# Patient Record
Sex: Male | Born: 1975 | Race: Black or African American | Hispanic: No | Marital: Single | Smoking: Current every day smoker
Health system: Southern US, Community
[De-identification: ages and names within clinical notes are randomized; demographics above are authoritative.]

## PROBLEM LIST (undated history)

## (undated) DIAGNOSIS — K219 Gastro-esophageal reflux disease without esophagitis: Secondary | ICD-10-CM

## (undated) DIAGNOSIS — F1010 Alcohol abuse, uncomplicated: Secondary | ICD-10-CM

## (undated) DIAGNOSIS — G629 Polyneuropathy, unspecified: Secondary | ICD-10-CM

## (undated) DIAGNOSIS — E876 Hypokalemia: Secondary | ICD-10-CM

## (undated) HISTORY — PX: TONSILLECTOMY AND ADENOIDECTOMY: SUR1326

---

## 2013-12-22 DIAGNOSIS — R748 Abnormal levels of other serum enzymes: Secondary | ICD-10-CM | POA: Diagnosis present

## 2016-01-26 ENCOUNTER — Inpatient Hospital Stay (HOSPITAL_BASED_OUTPATIENT_CLINIC_OR_DEPARTMENT_OTHER)
Admission: EM | Admit: 2016-01-26 | Discharge: 2016-02-02 | DRG: 641 | Disposition: A | Discharge status: Home / Self Care | Payer: Self-pay | Attending: Family Medicine | Admitting: Family Medicine

## 2016-01-26 ENCOUNTER — Encounter (HOSPITAL_BASED_OUTPATIENT_CLINIC_OR_DEPARTMENT_OTHER): Payer: Self-pay | Admitting: *Deleted

## 2016-01-26 DIAGNOSIS — H547 Unspecified visual loss: Secondary | ICD-10-CM | POA: Diagnosis present

## 2016-01-26 DIAGNOSIS — Z6821 Body mass index (BMI) 21.0-21.9, adult: Secondary | ICD-10-CM

## 2016-01-26 DIAGNOSIS — I10 Essential (primary) hypertension: Secondary | ICD-10-CM | POA: Diagnosis present

## 2016-01-26 DIAGNOSIS — E7220 Disorder of urea cycle metabolism, unspecified: Secondary | ICD-10-CM | POA: Diagnosis present

## 2016-01-26 DIAGNOSIS — F329 Major depressive disorder, single episode, unspecified: Secondary | ICD-10-CM | POA: Diagnosis present

## 2016-01-26 DIAGNOSIS — F17210 Nicotine dependence, cigarettes, uncomplicated: Secondary | ICD-10-CM | POA: Diagnosis present

## 2016-01-26 DIAGNOSIS — F10239 Alcohol dependence with withdrawal, unspecified: Secondary | ICD-10-CM | POA: Diagnosis present

## 2016-01-26 DIAGNOSIS — D649 Anemia, unspecified: Secondary | ICD-10-CM | POA: Diagnosis present

## 2016-01-26 DIAGNOSIS — E876 Hypokalemia: Principal | ICD-10-CM | POA: Diagnosis present

## 2016-01-26 DIAGNOSIS — G608 Other hereditary and idiopathic neuropathies: Secondary | ICD-10-CM | POA: Diagnosis present

## 2016-01-26 DIAGNOSIS — F1020 Alcohol dependence, uncomplicated: Secondary | ICD-10-CM | POA: Diagnosis present

## 2016-01-26 DIAGNOSIS — R748 Abnormal levels of other serum enzymes: Secondary | ICD-10-CM | POA: Diagnosis present

## 2016-01-26 DIAGNOSIS — H539 Unspecified visual disturbance: Secondary | ICD-10-CM

## 2016-01-26 DIAGNOSIS — M7989 Other specified soft tissue disorders: Secondary | ICD-10-CM | POA: Diagnosis present

## 2016-01-26 DIAGNOSIS — Z789 Other specified health status: Secondary | ICD-10-CM

## 2016-01-26 DIAGNOSIS — E512 Wernicke's encephalopathy: Secondary | ICD-10-CM | POA: Diagnosis present

## 2016-01-26 DIAGNOSIS — R740 Nonspecific elevation of levels of transaminase and lactic acid dehydrogenase [LDH]: Secondary | ICD-10-CM | POA: Diagnosis present

## 2016-01-26 DIAGNOSIS — H109 Unspecified conjunctivitis: Secondary | ICD-10-CM | POA: Diagnosis present

## 2016-01-26 DIAGNOSIS — G629 Polyneuropathy, unspecified: Secondary | ICD-10-CM

## 2016-01-26 DIAGNOSIS — E440 Moderate protein-calorie malnutrition: Secondary | ICD-10-CM | POA: Diagnosis present

## 2016-01-26 DIAGNOSIS — Z7289 Other problems related to lifestyle: Secondary | ICD-10-CM

## 2016-01-26 LAB — CBC WITH DIFFERENTIAL/PLATELET
BASOS ABS: 0.1 10*3/uL (ref 0.0–0.1)
BASOS PCT: 1 %
EOS ABS: 0.1 10*3/uL (ref 0.0–0.7)
Eosinophils Relative: 1 %
HCT: 30.2 % — ABNORMAL LOW (ref 39.0–52.0)
HEMOGLOBIN: 11.2 g/dL — AB (ref 13.0–17.0)
LYMPHS ABS: 2.6 10*3/uL (ref 0.7–4.0)
LYMPHS PCT: 25 %
MCH: 40 pg — AB (ref 26.0–34.0)
MCHC: 37.1 g/dL — AB (ref 30.0–36.0)
MCV: 107.9 fL — ABNORMAL HIGH (ref 78.0–100.0)
MONO ABS: 1.3 10*3/uL — AB (ref 0.1–1.0)
Monocytes Relative: 13 %
NEUTROS ABS: 6.2 10*3/uL (ref 1.7–7.7)
Neutrophils Relative %: 60 %
PLATELETS: 208 10*3/uL (ref 150–400)
RBC: 2.8 MIL/uL — ABNORMAL LOW (ref 4.22–5.81)
RDW: 14.1 % (ref 11.5–15.5)
WBC: 10.3 10*3/uL (ref 4.0–10.5)

## 2016-01-26 LAB — URINALYSIS, ROUTINE W REFLEX MICROSCOPIC
Glucose, UA: NEGATIVE mg/dL
Hgb urine dipstick: NEGATIVE
Ketones, ur: 15 mg/dL — AB
LEUKOCYTES UA: NEGATIVE
NITRITE: NEGATIVE
PH: 6.5 (ref 5.0–8.0)
Protein, ur: NEGATIVE mg/dL
SPECIFIC GRAVITY, URINE: 1.016 (ref 1.005–1.030)

## 2016-01-26 LAB — COMPREHENSIVE METABOLIC PANEL
ALBUMIN: 3.3 g/dL — AB (ref 3.5–5.0)
ALK PHOS: 182 U/L — AB (ref 38–126)
ALT: 47 U/L (ref 17–63)
ANION GAP: 19 — AB (ref 5–15)
AST: 137 U/L — ABNORMAL HIGH (ref 15–41)
BUN: 5 mg/dL — ABNORMAL LOW (ref 6–20)
CALCIUM: 7 mg/dL — AB (ref 8.9–10.3)
CHLORIDE: 84 mmol/L — AB (ref 101–111)
CO2: 31 mmol/L (ref 22–32)
Creatinine, Ser: 0.55 mg/dL — ABNORMAL LOW (ref 0.61–1.24)
GFR calc non Af Amer: >60 mL/min (ref >60)
GLUCOSE: 134 mg/dL — AB (ref 65–99)
POTASSIUM: 1.9 mmol/L — AB (ref 3.5–5.1)
SODIUM: 134 mmol/L — AB (ref 135–145)
Total Bilirubin: 1.4 mg/dL — ABNORMAL HIGH (ref 0.3–1.2)
Total Protein: 6.8 g/dL (ref 6.5–8.1)

## 2016-01-26 LAB — PHOSPHORUS: Phosphorus: 2.7 mg/dL (ref 2.5–4.6)

## 2016-01-26 LAB — BRAIN NATRIURETIC PEPTIDE: B Natriuretic Peptide: 51.3 pg/mL (ref 0.0–100.0)

## 2016-01-26 LAB — ETHANOL: ALCOHOL ETHYL (B): 31 mg/dL — AB (ref <5)

## 2016-01-26 LAB — MAGNESIUM: MAGNESIUM: 1 mg/dL — AB (ref 1.7–2.4)

## 2016-01-26 LAB — CBG MONITORING, ED: GLUCOSE-CAPILLARY: 160 mg/dL — AB (ref 65–99)

## 2016-01-26 MED ORDER — ACETAMINOPHEN 500 MG PO TABS
1000.0000 mg | ORAL_TABLET | Freq: Once | ORAL | Status: AC
  Administered 2016-01-26: 1000 mg via ORAL
  Filled 2016-01-26: qty 2

## 2016-01-26 MED ORDER — SODIUM CHLORIDE 0.9 % IV SOLN
Freq: Once | INTRAVENOUS | Status: AC
  Administered 2016-01-26: via INTRAVENOUS

## 2016-01-26 MED ORDER — MAGNESIUM SULFATE 2 GM/50ML IV SOLN
2.0000 g | Freq: Once | INTRAVENOUS | Status: AC
  Administered 2016-01-26: 2 g via INTRAVENOUS

## 2016-01-26 MED ORDER — SODIUM CHLORIDE 0.9 % IV BOLUS (SEPSIS)
1000.0000 mL | Freq: Once | INTRAVENOUS | Status: AC
  Administered 2016-01-26: 1000 mL via INTRAVENOUS

## 2016-01-26 MED ORDER — POTASSIUM CHLORIDE 10 MEQ/100ML IV SOLN
10.0000 meq | INTRAVENOUS | Status: AC
  Administered 2016-01-26 – 2016-01-27 (×3): 10 meq via INTRAVENOUS
  Filled 2016-01-26 (×3): qty 100

## 2016-01-26 MED ORDER — MAGNESIUM SULFATE 50 % IJ SOLN
2.0000 g | Freq: Once | INTRAMUSCULAR | Status: DC
  Filled 2016-01-26: qty 4

## 2016-01-26 MED ORDER — POTASSIUM CHLORIDE CRYS ER 20 MEQ PO TBCR
40.0000 meq | EXTENDED_RELEASE_TABLET | Freq: Once | ORAL | Status: AC
  Administered 2016-01-26: 40 meq via ORAL
  Filled 2016-01-26: qty 2

## 2016-01-26 NOTE — ED Notes (Signed)
Nurse first-pt assisted from car to w/c to ED WR-pt was able to stand and take steps from car to w/c-w/c to scale in triage and back to w/c

## 2016-01-26 NOTE — ED Notes (Signed)
Attempted 2nd IV x2, unsuccessful.

## 2016-01-26 NOTE — ED Provider Notes (Signed)
[facility]   CSN: [id number] Arrival date & time: 01/26/16  2009  First Provider Contact:  First MD Initiated Contact with Patient 01/26/16 2019     By signing my name below, I, Albert Lara. Albert Lara, attest that this documentation has been prepared under the direction and in the presence of Lyndal Pulley, MD.  Electronically Signed: Suzan Lara. Albert Lara, ED Scribe. 01/26/16. 8:49 PM.    History   Chief Complaint Chief Complaint  Patient presents with  . Leg Swelling    The history is provided by the patient. No language interpreter was used.    HPI Comments: Albert Lara is a 40 y.o. male without any pertinent past medical history who presents to the Emergency Department complaining of intermittent, worsening bilateral leg pain with associated swelling x 2 months. Discomfort is described as "needles". Discomfort to feet exacerbated with deep and light touch to lower extremities. No alleviating factors at this time. No OTC/prescribed medications or home remedies attempted prior to arrival. Pt also reports bilateral visual disturbances described as "blurriness". He states "i can see that the TV is on but I cant see what is pictured". No prior evaluation for above symptoms. Pt states last complete physical was approximately 2 years ago. No known sick contacts. He reports long distance travel as he states he is often in a car for 4-5 hours at a time. Pt states Mother has a history of Type 1 diabetes. No known allergies to medications.   PCP: No primary care provider on file.    History reviewed. No pertinent past medical history.  There are no active problems to display for this patient.   Past Surgical History:  Procedure Laterality Date  . TONSILLECTOMY         Home Medications    Prior to Admission medications   Not on File    Family History History reviewed. No pertinent family history.  Social History Social History  Substance Use Topics  .  Smoking status: Current Every Day Smoker    Packs/day: 1.00    Types: Cigarettes  . Smokeless tobacco: Not on file  . Alcohol use Yes     Comment: quart of liquor  today     Allergies   Review of patient's allergies indicates no known allergies.   Review of Systems Review of Systems  Constitutional: Negative for chills and fever.  Eyes: Positive for visual disturbance.  Respiratory: Negative for cough.   Cardiovascular: Positive for leg swelling.  Musculoskeletal: Positive for arthralgias.  Neurological: Negative for weakness.  Psychiatric/Behavioral: Negative for confusion.  All other systems reviewed and are negative.    Physical Exam Updated Vital Signs BP 118/92   Pulse (!) 138   Temp 99.1 F (37.3 C)   Resp 18   Ht 5\' 8"  (1.727 m)   Wt 146 lb (66.2 kg)   SpO2 100%   BMI 22.20 kg/m   Physical Exam  Constitutional: He is oriented to person, place, and time. He appears well-developed and well-nourished.  HENT:  Head: Normocephalic and atraumatic.  Eyes: EOM are normal.  4 mm crusted lesion just medial to the R medial campus. It is well demarcated.   Neck: Normal range of motion.  Cardiovascular: Normal rate, regular rhythm, normal heart sounds and intact distal pulses.  Exam reveals no friction rub.   No murmur heard. Pulmonary/Chest: Effort normal and breath sounds normal. No respiratory distress. He has no wheezes. He has no rales.  Abdominal: Soft. He  exhibits no distension. There is no tenderness.  Musculoskeletal: Normal range of motion.  3+ pitting edema on the R up to the level of the ankle. 2+ pitting edema on the L up to the level of the ankle.  Neurological: He is alert and oriented to person, place, and time.  Skin: Skin is warm and dry.  Psychiatric: He has a normal mood and affect. Judgment normal.  Nursing note and vitals reviewed.    ED Treatments / Results   DIAGNOSTIC STUDIES: Oxygen Saturation is 100% on RA, Normal by my  interpretation.    COORDINATION OF CARE: 8:45 PM- Will order blood work. Discussed treatment plan with pt at bedside and pt agreed to plan.     Labs (all labs ordered are listed, but only abnormal results are displayed) Labs Reviewed  CBC WITH DIFFERENTIAL/PLATELET - Abnormal; Notable for the following:       Result Value   RBC 2.80 (*)    Hemoglobin 11.2 (*)    HCT 30.2 (*)    MCV 107.9 (*)    MCH 40.0 (*)    MCHC 37.1 (*)    Monocytes Absolute 1.3 (*)    All other components within normal limits  COMPREHENSIVE METABOLIC PANEL - Abnormal; Notable for the following:    Sodium 134 (*)    Potassium 1.9 (*)    Chloride 84 (*)    Glucose, Bld 134 (*)    BUN 5 (*)    Creatinine, Ser 0.55 (*)    Calcium 7.0 (*)    Albumin 3.3 (*)    AST 137 (*)    Alkaline Phosphatase 182 (*)    Total Bilirubin 1.4 (*)    Anion gap 19 (*)    All other components within normal limits  URINALYSIS, ROUTINE W REFLEX MICROSCOPIC (NOT AT [facility]) - Abnormal; Notable for the following:    Color, Urine AMBER (*)    Bilirubin Urine SMALL (*)    Ketones, ur 15 (*)    All other components within normal limits  ETHANOL - Abnormal; Notable for the following:    Alcohol, Ethyl (B) 31 (*)    All other components within normal limits  MAGNESIUM - Abnormal; Notable for the following:    Magnesium 1.0 (*)    All other components within normal limits  CBG MONITORING, ED - Abnormal; Notable for the following:    Glucose-Capillary 160 (*)    All other components within normal limits  BRAIN NATRIURETIC PEPTIDE  PHOSPHORUS  RPR  TSH  T4, FREE    EKG  EKG Interpretation  Date/Time:  Thursday January 26 2016 21:44:35 EDT Ventricular Rate:  135 PR Interval:    QRS Duration: 69 QT Interval:  324 QTC Calculation: 486 R Axis:   44 Text Interpretation:  Sinus tachycardia Anteroseptal infarct, old Nonspecific T abnormalities, lateral leads Baseline wander in lead(s) V2 V6 Confirmed by Jaimeson Gopal MD, Maiya Kates ([id number])  on 01/26/2016 10:09:00 PM       Radiology No results found.  Procedures Procedures (including critical care time)  CRITICAL CARE Performed by: Lyndal Pulley Total critical care time: 30 minutes Critical care time was exclusive of separately billable procedures and treating other patients. Critical care was necessary to treat or prevent imminent or life-threatening deterioration. Critical care was time spent personally by me on the following activities: development of treatment plan with patient and/or surrogate as well as nursing, discussions with consultants, evaluation of patient's response to treatment, examination of patient, obtaining history from  patient or surrogate, ordering and performing treatments and interventions, ordering and review of laboratory studies, ordering and review of radiographic studies, pulse oximetry and re-evaluation of patient's condition.  Medications Ordered in ED Medications - No data to display   Initial Impression / Assessment and Plan / ED Course  I have reviewed the triage vital signs and the nursing notes.  Pertinent labs & imaging results that were available during my care of the patient were reviewed by me and considered in my medical decision making (see chart for details).  Clinical Course    40 year old male chronic alcoholic presents with progressive bilateral lower extremity pitting edema, pain, visual difficulty and a sore on the right side of his nose. He has no medical care prior to today and does not carry any diagnoses or medications. He endorses a neuropathic pain of both of his legs and his hands that is very severe with loss of sensation in a stocking and glove distribution.   His daily pint of vodka consumption appears to have contributed to a critical acute hypokalemia and concomitant hypomagnesemia which appear to be inducing neurologic symptoms. No EKG changes were noted. Plan will be for admission for cardiac monitoring and  correction of electrolyte disturbances and to complete workup for potential etiologies. IV potassium and magnesium as well as oral potassium were provided in the emergency department. Patient will need monitoring for withdrawal as an inpatient. Hospitalist was consulted for admission and accepted the patient in transfer to facility capable of caring for patient further.  Final Clinical Impressions(s) / ED Diagnoses   Final diagnoses:  Acute hypokalemia  Hypomagnesemia   I personally performed the services described in this documentation, which was scribed in my presence. The recorded information has been reviewed and is accurate.    New Prescriptions New Prescriptions   No medications on file     Lyndal Pulley, MD 01/26/16 2329

## 2016-01-26 NOTE — ED Triage Notes (Addendum)
Pt c/o bil leg swelling and pain x 2 months. ETOH

## 2016-01-26 NOTE — ED Notes (Addendum)
Lab called with critical K+ of 1.9. Dr. Clydene Pugh notified. Pt placed on cardiac monitor with EKG obtained. IV site initiated.

## 2016-01-27 ENCOUNTER — Inpatient Hospital Stay (HOSPITAL_COMMUNITY): Payer: Self-pay

## 2016-01-27 ENCOUNTER — Encounter (HOSPITAL_BASED_OUTPATIENT_CLINIC_OR_DEPARTMENT_OTHER): Payer: Self-pay | Admitting: Emergency Medicine

## 2016-01-27 DIAGNOSIS — H539 Unspecified visual disturbance: Secondary | ICD-10-CM

## 2016-01-27 DIAGNOSIS — G629 Polyneuropathy, unspecified: Secondary | ICD-10-CM

## 2016-01-27 DIAGNOSIS — R748 Abnormal levels of other serum enzymes: Secondary | ICD-10-CM

## 2016-01-27 DIAGNOSIS — M79609 Pain in unspecified limb: Secondary | ICD-10-CM

## 2016-01-27 DIAGNOSIS — E440 Moderate protein-calorie malnutrition: Secondary | ICD-10-CM | POA: Insufficient documentation

## 2016-01-27 DIAGNOSIS — E876 Hypokalemia: Secondary | ICD-10-CM | POA: Diagnosis present

## 2016-01-27 DIAGNOSIS — M7989 Other specified soft tissue disorders: Secondary | ICD-10-CM

## 2016-01-27 DIAGNOSIS — F1020 Alcohol dependence, uncomplicated: Secondary | ICD-10-CM | POA: Diagnosis present

## 2016-01-27 LAB — IRON AND TIBC
Iron: 155 ug/dL (ref 45–182)
SATURATION RATIOS: 91 % — AB (ref 17.9–39.5)
TIBC: 171 ug/dL — AB (ref 250–450)
UIBC: 16 ug/dL

## 2016-01-27 LAB — COMPREHENSIVE METABOLIC PANEL
ALBUMIN: 2.8 g/dL — AB (ref 3.5–5.0)
ALK PHOS: 181 U/L — AB (ref 38–126)
ALT: 41 U/L (ref 17–63)
ANION GAP: 14 (ref 5–15)
AST: 122 U/L — ABNORMAL HIGH (ref 15–41)
BUN: <5 mg/dL — ABNORMAL LOW (ref 6–20)
CHLORIDE: 92 mmol/L — AB (ref 101–111)
CO2: 30 mmol/L (ref 22–32)
Calcium: 6.6 mg/dL — ABNORMAL LOW (ref 8.9–10.3)
Creatinine, Ser: 0.66 mg/dL (ref 0.61–1.24)
GFR calc Af Amer: >60 mL/min (ref >60)
GFR calc non Af Amer: >60 mL/min (ref >60)
GLUCOSE: 99 mg/dL (ref 65–99)
POTASSIUM: 3 mmol/L — AB (ref 3.5–5.1)
SODIUM: 136 mmol/L (ref 135–145)
Total Bilirubin: 1.4 mg/dL — ABNORMAL HIGH (ref 0.3–1.2)
Total Protein: 5.8 g/dL — ABNORMAL LOW (ref 6.5–8.1)

## 2016-01-27 LAB — BASIC METABOLIC PANEL
Anion gap: 8 (ref 5–15)
CHLORIDE: 100 mmol/L — AB (ref 101–111)
CO2: 28 mmol/L (ref 22–32)
Calcium: 6.6 mg/dL — ABNORMAL LOW (ref 8.9–10.3)
Creatinine, Ser: 0.49 mg/dL — ABNORMAL LOW (ref 0.61–1.24)
GFR calc Af Amer: >60 mL/min (ref >60)
GFR calc non Af Amer: >60 mL/min (ref >60)
GLUCOSE: 91 mg/dL (ref 65–99)
POTASSIUM: 3.9 mmol/L (ref 3.5–5.1)
Sodium: 136 mmol/L (ref 135–145)

## 2016-01-27 LAB — LACTIC ACID, PLASMA
LACTIC ACID, VENOUS: 2.6 mmol/L — AB (ref 0.5–1.9)
LACTIC ACID, VENOUS: 2.3 mmol/L — AB (ref 0.5–1.9)
Lactic Acid, Venous: 2.6 mmol/L (ref 0.5–1.9)

## 2016-01-27 LAB — CBC
HEMATOCRIT: 28.5 % — AB (ref 39.0–52.0)
HEMOGLOBIN: 9.9 g/dL — AB (ref 13.0–17.0)
MCH: 39.1 pg — AB (ref 26.0–34.0)
MCHC: 34.7 g/dL (ref 30.0–36.0)
MCV: 112.6 fL — AB (ref 78.0–100.0)
Platelets: 142 10*3/uL — ABNORMAL LOW (ref 150–400)
RBC: 2.53 MIL/uL — ABNORMAL LOW (ref 4.22–5.81)
RDW: 15.6 % — ABNORMAL HIGH (ref 11.5–15.5)
WBC: 11.2 10*3/uL — ABNORMAL HIGH (ref 4.0–10.5)

## 2016-01-27 LAB — URIC ACID: URIC ACID, SERUM: 3.6 mg/dL — AB (ref 4.4–7.6)

## 2016-01-27 LAB — VITAMIN B12: Vitamin B-12: 302 pg/mL (ref 180–914)

## 2016-01-27 LAB — HIV ANTIBODY (ROUTINE TESTING W REFLEX): HIV SCREEN 4TH GENERATION: NONREACTIVE

## 2016-01-27 LAB — URINALYSIS, ROUTINE W REFLEX MICROSCOPIC
Glucose, UA: NEGATIVE mg/dL
HGB URINE DIPSTICK: NEGATIVE
Ketones, ur: NEGATIVE mg/dL
Leukocytes, UA: NEGATIVE
Nitrite: NEGATIVE
Protein, ur: NEGATIVE mg/dL
SPECIFIC GRAVITY, URINE: 1.016 (ref 1.005–1.030)
pH: 7 (ref 5.0–8.0)

## 2016-01-27 LAB — FERRITIN: Ferritin: 1915 ng/mL — ABNORMAL HIGH (ref 24–336)

## 2016-01-27 LAB — AMYLASE: Amylase: 98 U/L (ref 28–100)

## 2016-01-27 LAB — T4, FREE: Free T4: 1.06 ng/dL (ref 0.61–1.12)

## 2016-01-27 LAB — RETICULOCYTES
RBC.: 2.53 MIL/uL — ABNORMAL LOW (ref 4.22–5.81)
Retic Count, Absolute: 50.6 10*3/uL (ref 19.0–186.0)
Retic Ct Pct: 2 % (ref 0.4–3.1)

## 2016-01-27 LAB — LIPASE, BLOOD: Lipase: 43 U/L (ref 11–51)

## 2016-01-27 LAB — TSH
TSH: 3.582 u[IU]/mL (ref 0.350–4.500)
TSH: 3.334 u[IU]/mL (ref 0.350–4.500)

## 2016-01-27 LAB — GLUCOSE, CAPILLARY: Glucose-Capillary: 132 mg/dL — ABNORMAL HIGH (ref 65–99)

## 2016-01-27 MED ORDER — TRAMADOL-ACETAMINOPHEN 37.5-325 MG PO TABS: 1.0000 | ORAL_TABLET | Freq: Once | ORAL | Status: DC

## 2016-01-27 MED ORDER — MORPHINE SULFATE (PF) 4 MG/ML IV SOLN
4.0000 mg | Freq: Once | INTRAVENOUS | Status: AC
  Administered 2016-01-27: 4 mg via INTRAVENOUS

## 2016-01-27 MED ORDER — FAMOTIDINE 20 MG PO TABS
20.0000 mg | ORAL_TABLET | Freq: Two times a day (BID) | ORAL | Status: DC
  Administered 2016-01-28 – 2016-02-02 (×12): 20 mg via ORAL
  Filled 2016-01-27 (×12): qty 1

## 2016-01-27 MED ORDER — TRAMADOL HCL 50 MG PO TABS
50.0000 mg | ORAL_TABLET | Freq: Four times a day (QID) | ORAL | Status: DC | PRN
  Administered 2016-01-27 – 2016-02-02 (×12): 50 mg via ORAL
  Filled 2016-01-27 (×12): qty 1

## 2016-01-27 MED ORDER — ENOXAPARIN SODIUM 40 MG/0.4ML ~~LOC~~ SOLN
40.0000 mg | SUBCUTANEOUS | Status: DC
  Administered 2016-01-27 – 2016-02-02 (×7): 40 mg via SUBCUTANEOUS
  Filled 2016-01-27 (×7): qty 0.4

## 2016-01-27 MED ORDER — SODIUM CHLORIDE 0.9 % IV SOLN
1.0000 g | Freq: Once | INTRAVENOUS | Status: AC
  Administered 2016-01-27: 1 g via INTRAVENOUS
  Filled 2016-01-27: qty 10

## 2016-01-27 MED ORDER — BOOST / RESOURCE BREEZE PO LIQD
1.0000 | Freq: Three times a day (TID) | ORAL | Status: DC
  Administered 2016-01-27 – 2016-02-01 (×14): 1 via ORAL

## 2016-01-27 MED ORDER — THIAMINE HCL 100 MG/ML IJ SOLN
100.0000 mg | Freq: Every day | INTRAMUSCULAR | Status: DC
  Filled 2016-01-27: qty 2

## 2016-01-27 MED ORDER — ACETAMINOPHEN 325 MG PO TABS
325.0000 mg | ORAL_TABLET | Freq: Once | ORAL | Status: AC
  Administered 2016-01-27: 325 mg via ORAL
  Filled 2016-01-27: qty 1

## 2016-01-27 MED ORDER — ENSURE ENLIVE PO LIQD
237.0000 mL | Freq: Two times a day (BID) | ORAL | Status: DC
  Administered 2016-01-27: 237 mL via ORAL

## 2016-01-27 MED ORDER — PNEUMOCOCCAL VAC POLYVALENT 25 MCG/0.5ML IJ INJ
0.5000 mL | INJECTION | INTRAMUSCULAR | Status: AC
  Administered 2016-01-30: 0.5 mL via INTRAMUSCULAR
  Filled 2016-01-27 (×2): qty 0.5

## 2016-01-27 MED ORDER — ACETAMINOPHEN 325 MG PO TABS
650.0000 mg | ORAL_TABLET | Freq: Four times a day (QID) | ORAL | Status: DC | PRN
  Administered 2016-01-29 (×3): 650 mg via ORAL
  Filled 2016-01-27 (×3): qty 2

## 2016-01-27 MED ORDER — SODIUM CHLORIDE 0.9 % IV SOLN
INTRAVENOUS | Status: DC
  Administered 2016-01-27 – 2016-01-29 (×2): via INTRAVENOUS

## 2016-01-27 MED ORDER — LORAZEPAM 1 MG PO TABS
1.0000 mg | ORAL_TABLET | Freq: Four times a day (QID) | ORAL | Status: DC | PRN
  Administered 2016-01-28 – 2016-01-31 (×7): 1 mg via ORAL
  Filled 2016-01-27 (×7): qty 1

## 2016-01-27 MED ORDER — GABAPENTIN 400 MG PO CAPS
400.0000 mg | ORAL_CAPSULE | Freq: Three times a day (TID) | ORAL | Status: DC
  Administered 2016-01-27 – 2016-02-02 (×20): 400 mg via ORAL
  Filled 2016-01-27 (×19): qty 1

## 2016-01-27 MED ORDER — ACETAMINOPHEN 650 MG RE SUPP: 650.0000 mg | Freq: Four times a day (QID) | RECTAL | Status: DC | PRN

## 2016-01-27 MED ORDER — SODIUM CHLORIDE 0.9 % IV BOLUS (SEPSIS)
1000.0000 mL | Freq: Once | INTRAVENOUS | Status: AC
  Administered 2016-01-27: 1000 mL via INTRAVENOUS

## 2016-01-27 MED ORDER — CIPROFLOXACIN HCL 0.3 % OP SOLN
1.0000 [drp] | OPHTHALMIC | Status: DC
  Administered 2016-01-27 – 2016-02-02 (×28): 1 [drp] via OPHTHALMIC
  Filled 2016-01-27: qty 2.5

## 2016-01-27 MED ORDER — VITAMIN B-1 100 MG PO TABS
100.0000 mg | ORAL_TABLET | Freq: Every day | ORAL | Status: DC
  Administered 2016-01-27 – 2016-01-31 (×5): 100 mg via ORAL
  Filled 2016-01-27 (×5): qty 1

## 2016-01-27 MED ORDER — MORPHINE SULFATE (PF) 4 MG/ML IV SOLN
INTRAVENOUS | Status: AC
  Administered 2016-01-27: 4 mg via INTRAVENOUS
  Filled 2016-01-27: qty 1

## 2016-01-27 MED ORDER — SODIUM CHLORIDE 0.9% FLUSH
3.0000 mL | Freq: Two times a day (BID) | INTRAVENOUS | Status: DC
  Administered 2016-01-27 – 2016-02-01 (×12): 3 mL via INTRAVENOUS

## 2016-01-27 MED ORDER — FOLIC ACID 1 MG PO TABS
1.0000 mg | ORAL_TABLET | Freq: Every day | ORAL | Status: DC
  Administered 2016-01-27 – 2016-02-02 (×7): 1 mg via ORAL
  Filled 2016-01-27 (×7): qty 1

## 2016-01-27 MED ORDER — MORPHINE SULFATE (PF) 2 MG/ML IV SOLN
1.0000 mg | INTRAVENOUS | Status: DC | PRN
  Administered 2016-01-27 – 2016-01-28 (×4): 1 mg via INTRAVENOUS
  Filled 2016-01-27 (×5): qty 1

## 2016-01-27 MED ORDER — POTASSIUM CHLORIDE CRYS ER 20 MEQ PO TBCR
40.0000 meq | EXTENDED_RELEASE_TABLET | Freq: Three times a day (TID) | ORAL | Status: DC
Start: 2016-01-27 — End: 2016-01-27

## 2016-01-27 MED ORDER — ADULT MULTIVITAMIN W/MINERALS CH
1.0000 | ORAL_TABLET | Freq: Every day | ORAL | Status: DC
  Administered 2016-01-27 – 2016-02-02 (×7): 1 via ORAL
  Filled 2016-01-27 (×7): qty 1

## 2016-01-27 MED ORDER — CALCIUM CARBONATE 1250 (500 CA) MG PO TABS
1.0000 | ORAL_TABLET | Freq: Two times a day (BID) | ORAL | Status: DC
  Administered 2016-01-27 – 2016-02-02 (×10): 500 mg via ORAL
  Filled 2016-01-27 (×10): qty 1

## 2016-01-27 MED ORDER — VITAMIN B-12 100 MCG PO TABS
100.0000 ug | ORAL_TABLET | Freq: Every day | ORAL | Status: DC
  Administered 2016-01-27 – 2016-01-29 (×3): 100 ug via ORAL
  Filled 2016-01-27 (×3): qty 1

## 2016-01-27 MED ORDER — TRAMADOL HCL 50 MG PO TABS
50.0000 mg | ORAL_TABLET | Freq: Once | ORAL | Status: AC
  Administered 2016-01-27: 50 mg via ORAL
  Filled 2016-01-27: qty 1

## 2016-01-27 MED ORDER — GABAPENTIN 400 MG PO CAPS
400.0000 mg | ORAL_CAPSULE | Freq: Three times a day (TID) | ORAL | Status: DC
  Filled 2016-01-27: qty 1

## 2016-01-27 MED ORDER — POTASSIUM CHLORIDE CRYS ER 20 MEQ PO TBCR
40.0000 meq | EXTENDED_RELEASE_TABLET | Freq: Three times a day (TID) | ORAL | Status: DC
  Administered 2016-01-27 (×3): 40 meq via ORAL
  Filled 2016-01-27 (×3): qty 2

## 2016-01-27 MED ORDER — POTASSIUM CHLORIDE IN NACL 40-0.9 MEQ/L-% IV SOLN
INTRAVENOUS | Status: DC
Start: 2016-01-27 — End: 2016-01-27
  Administered 2016-01-27 (×2): 100 mL/h via INTRAVENOUS
  Filled 2016-01-27 (×2): qty 1000

## 2016-01-27 MED ORDER — MAGNESIUM SULFATE 2 GM/50ML IV SOLN
2.0000 g | Freq: Once | INTRAVENOUS | Status: AC
  Administered 2016-01-27: 2 g via INTRAVENOUS
  Filled 2016-01-27: qty 50

## 2016-01-27 NOTE — Progress Notes (Addendum)
Pt says he wants some pain med.He says Tylenol won't help his pain. MD notified.  Md advised to give Neurontin stat. He also advised that the pt would start his scheduled dose at 10:00am.   Upon reassessment after the administration of the pain med, the pain did not improve per pt. MD was notified about the pt's situation of pain.  Will continue to monitor.  Tramadol and Tylenol were ordered and given. Will continue to monitor.

## 2016-01-27 NOTE — Progress Notes (Signed)
PROGRESS NOTE    Albert Lara  FTD:[id number] DOB: 1976/02/07 DOA: 01/26/2016 PCP: No primary care provider on file.    Brief Narrative: Albert Lara is a 40 y.o. male with a past medical history significant for depression and alcohol use who presents with multiple complaints for 2 months.  The patient has a hard time specifying the chronicity of his ailments, but somewhere over the last 2 months, he has had worsening bilateral leg numbness, progressing to shooting severe pains in both calves, numbness/paresthesias in both fingers/hands, loss of vision ("hazy" or "blurry" vision), and leg weakness to the point that he can't climb stairs this week.  He had been living with his mother in [place] until a few weeks ago, when he came to [place].  He has been drinking up to 1 quart of vodka daily for many months, "I barely eat" and had lost >50 lbs.  Does not use injection drugs.  No previous medical complaints, no daily medications, remote history of depression.  Tonight, a family member taking care of him here in [place] made him come to the ER    Assessment & Plan:   Principal Problem:   Acute hypokalemia Active Problems:   Hypomagnesemia   Abnormal levels of other serum enzymes   Vision changes   Hypocalcemia   Alcohol use disorder, severe, dependence (HCC)   Leg swelling   Peripheral neuropathy (HCC)   Hypokalemia  1. Malnutrition: The patient has multiple complaints that appear at first to be related to malnutrition and electrolyte disturbances.  His neuropathy sounds as if it stretches back longer than the last 2 months, and so perhaps this is related to diabetes, which runs in the family.   -If his symptoms do not improve markedly with electrolyte repletion and/or his other testing does not confirm the cause of neuropathy, will need to consult Neurology  1. Hypokalemia:  Suspect this is acute or subacute from alcohol use and poor intake.    Given 70 mEQ K in  ER. -Give oral K 40 mEq TID  improved.   2. Hypocalcemia and hypomagnesemia:  received calcium and mg supplement.  calcium corrected by albumin 7.6. Will start oral supplement   3. Transaminitis:  Likely from alcohol. -acute hepatitis panel and [place].   4. Anion gap elevation without acidosis by BMP:  Infection is not present. -Check lactic acid and osmolal gap  5. Alcohol use disorder, severe, desires to quit:  -Gabapentin 400 mg TID -CIWA scoring -Folate, thiamine, and MVI ordered -Consult to Social Work  6. Neuropathy:  Diabetes vs B12 deficiency.  HIV and syphilis doubted.  Primary neurologic disease doubted but within differential. -Hgb-A1c pending  - B12 level low normal, check MMA. Start supplements.  - HIV negative and follow RPR -check vitamin D.  -TSH normal.   7. Vision loss:  Suspect this is from malnutrition. Unclear etiology  -vitamin A level, thiamine, folic acid, pending  opthalmology consulted. Dr Charlotte Sanes recommend to check vitamin level and patient to follow in the office on Monday.  Also to check for pancreatitis.  conjunctivitis; start cipro.   8. Leg swelling: Suspect again this is from malnutrition.   Doppler negative for DVT  Uric acid negative.   9. Anemia, normocytic, unclear cause: --iron 155, ferritin 1915 -folate pending. and B-12 low normal, check MMA.  -start B 12 supplement.       DVT prophylaxis: lovenox.  Code Status: full code.  Family Communication: care discussed with patient.  Disposition Plan: to  be determine    Consultants:   Opthalmology Dr Charlotte Sanes , recommend vitamin B 12, thiamine, PRP and patient to follow in her office next week.    Procedures:   Doppler lower extremities negative for DVT   Antimicrobials:   none   Subjective: He report vision loss for one month or two, he see blurry.  He has drainage from right eye./  He relates pain lower extremities, numbness, no weakness, no back pain.      Objective: Vitals:   01/27/16 0030 01/27/16 0143 01/27/16 0400 01/27/16 0502  BP: 122/86 (!) 119/91  119/83  Pulse: (!) 121 (!) 112  (!) 105  Resp: 19 18  18   Temp:  98.6 F (37 C)  98.4 F (36.9 C)  TempSrc:  Oral    SpO2: 98% 91%  94%  Weight:   67.3 kg (148 lb 5.9 oz)   Height:   5\' 8"  (1.727 m)     Intake/Output Summary (Last 24 hours) at 01/27/16 1124 Last data filed at 01/27/16 0900  Gross per 24 hour  Intake              450 ml  Output                0 ml  Net              450 ml   Filed Weights   01/26/16 2014 01/27/16 0400  Weight: 66.2 kg (146 lb) 67.3 kg (148 lb 5.9 oz)    Examination:  General exam: Appears calm and comfortable  Respiratory system: Clear to auscultation. Respiratory effort normal. Cardiovascular system: S1 & S2 heard, RRR. No JVD, murmurs, rubs, gallops or clicks. No pedal edema. Gastrointestinal system: Abdomen is nondistended, soft and nontender. No organomegaly or masses felt. Normal bowel sounds heard. Central nervous system: Alert and oriented. No focal neurological deficits. Extremities: Symmetric 5 x 5 power. Skin: No rashes, lesions or ulcers Psychiatry: Judgement and insight appear normal. Mood & affect appropriate.     Data Reviewed: I have personally reviewed following labs and imaging studies  CBC:  Recent Labs Lab 01/26/16 2055 01/27/16 0603  WBC 10.3 11.2*  NEUTROABS 6.2  --   HGB 11.2* 9.9*  HCT 30.2* 28.5*  MCV 107.9* 112.6*  PLT 208 142*   Basic Metabolic Panel:  Recent Labs Lab 01/26/16 2055 01/26/16 2114 01/27/16 0603  NA 134*  --  136  K 1.9*  --  3.0*  CL 84*  --  92*  CO2 31  --  30  GLUCOSE 134*  --  99  BUN 5*  --  <5*  CREATININE 0.55*  --  0.66  CALCIUM 7.0*  --  6.6*  MG  --  1.0*  --   PHOS  --  2.7  --    GFR: Estimated Creatinine Clearance: 118 mL/min (by C-G formula based on SCr of 0.8 mg/dL). Liver Function Tests:  Recent Labs Lab 01/26/16 2055 01/27/16 0603  AST 137*  122*  ALT 47 41  ALKPHOS 182* 181*  BILITOT 1.4* 1.4*  PROT 6.8 5.8*  ALBUMIN 3.3* 2.8*   No results for input(s): LIPASE, AMYLASE in the last 168 hours. No results for input(s): AMMONIA in the last 168 hours. Coagulation Profile: No results for input(s): INR, PROTIME in the last 168 hours. Cardiac Enzymes: No results for input(s): CKTOTAL, CKMB, CKMBINDEX, TROPONINI in the last 168 hours. BNP (last 3 results) No results for input(s): PROBNP in the  last 8760 hours. HbA1C: No results for input(s): HGBA1C in the last 72 hours. CBG:  Recent Labs Lab 01/26/16 2106  GLUCAP 160*   Lipid Profile: No results for input(s): CHOL, HDL, LDLCALC, TRIG, CHOLHDL, LDLDIRECT in the last 72 hours. Thyroid Function Tests:  Recent Labs  01/26/16 2114 01/27/16 0248  TSH 3.582 3.334  FREET4 1.06  --    Anemia Panel:  Recent Labs  01/27/16 0235 01/27/16 0603  VITAMINB12 302  --   FERRITIN 1,915*  --   TIBC 171*  --   IRON 155  --   RETICCTPCT  --  2.0   Sepsis Labs:  Recent Labs Lab 01/27/16 0235 01/27/16 0606  LATICACIDVEN 2.6* 2.6*    No results found for this or any previous visit (from the past 240 hour(s)).       Radiology Studies: No results found.      Scheduled Meds: . enoxaparin (LOVENOX) injection  40 mg Subcutaneous Q24H  . feeding supplement (ENSURE ENLIVE)  237 mL Oral BID BM  . folic acid  1 mg Oral Daily  . gabapentin  400 mg Oral TID  . multivitamin with minerals  1 tablet Oral Daily  . [START ON 01/28/2016] pneumococcal 23 valent vaccine  0.5 mL Intramuscular Tomorrow-1000  . potassium chloride  40 mEq Oral TID  . sodium chloride flush  3 mL Intravenous Q12H  . thiamine  100 mg Oral Daily   Or  . thiamine  100 mg Intravenous Daily   Continuous Infusions: . 0.9 % NaCl with KCl 40 mEq / L 100 mL/hr (01/27/16 0307)     LOS: 0 days    Time spent: 35 minutes.     Alba Cory, MD [facility] Pager [phone]  If  7PM-7AM, please contact night-coverage [email or web address] Password [email or web address] 01/27/2016, 11:24 AM

## 2016-01-27 NOTE — Progress Notes (Signed)
Pt arrived the floor at 1:30am, MD notified.

## 2016-01-27 NOTE — H&P (Signed)
History and Physical  Patient Name: Albert Lara     JYN:[id number]    DOB: 01/23/1976    DOA: 01/26/2016 PCP: No primary care provider on file.   Patient coming from: Home  Chief Complaint: Leg pain, weakness, leg swelling, vision changes  HPI: Albert Lara is a 40 y.o. male with a past medical history significant for depression and alcohol use who presents with multiple complaints for 2 months.  The patient has a hard time specifying the chronicity of his ailments, but somewhere over the last 2 months, he has had worsening bilateral leg numbness, progressing to shooting severe pains in both calves, numbness/paresthesias in both fingers/hands, loss of vision ("hazy" or "blurry" vision), and leg weakness to the point that he can't climb stairs this week.  He had been living with his mother in [place] until a few weeks ago, when he came to [place].  He has been drinking up to 1 quart of vodka daily for many months, "I barely eat" and had lost >50 lbs.  Does not use injection drugs.  No previous medical complaints, no daily medications, remote history of depression.  Tonight, a family member taking care of him here in [place] made him come to the ER  ED course: -Afebrile, tachycardic to 130s, blood pressure and respirations normal -Na 134, K 1.9, Magnesium 1.0, Calcium 7.0, Phos normal, Cr 0.55, Anion gap elevated but Bicarbonate elevated as well, WBC 10.3K, Hgb 11.2 -AST slightly elevated at 137 U/L, greater than ALT, alcohol level at 30 mg/dL -ECG was sinus tachycardia -He was given 70 mEQ K and 2g mag sulfate and TRH were asked to evaluate for admission     ROS: Pt complains of blurry vision, leg weakness, paresthesias, tremor, dark urine, cramps, leg pain, weakness, weight loss, poor po intake.  Pt denies any shortness of breath, axis normal nocturnal dyspnea, orthopnea, heart racing, palpitations, chest pain, syncope, loss of consciousness, confusion, vomiting, diarrhea,  hematochezia, melena, rash, changes to the hair skin and nails.    All other systems negative except as just noted or noted in the history of present illness.    History reviewed. No pertinent past medical history.  Past Surgical History:  Procedure Laterality Date  . TONSILLECTOMY      Social History: Patient lived with his mother until a fe weeks ago when he moved from [facility] to here.  The patient walks unassisted usually, but can barely walk this week.  Smokes.  Drinks 1 fifth vodka daily.  No illicit drugs.  No Known Allergies  Family history: family history includes Diabetes in his mother.  Prior to Admission medications   Not on File       Physical Exam: BP (!) 119/91 (BP Location: Right Arm)   Pulse (!) 112   Temp 98.6 F (37 C)   Resp 18   Ht 5\' 8"  (1.727 m)   Wt 66.2 kg (146 lb)   SpO2 91%   BMI 22.20 kg/m  General appearance: Thin ill appearing tired adult male, alert and in no acute distress, but occasional shooting pain, and appears weak.   Eyes: Anicteric, conjunctiva pink, lids and lashes normal.    Slight watery discharge.  Pupils reactive.  Skin thickening at right medial canthus, appears to be from scratching. ENT: No nasal deformity, discharge, or epistaxis.  OP moist without lesions.   Lymph: No cervical or supraclavicular lymphadenopathy. Skin: Warm and dry.  No suspicious rashes or lesions.  Or changes to nails or hair.  Cardiac: Tachycardic, nl S1-S2, no murmurs appreciated.  Capillary refill is brisk.  JVP normal.  1+ pitting LE edema at ankles and shins.  Radial and DP pulses 2+ and symmetric. Respiratory: Normal respiratory rate and rhythm.  CTAB without rales or wheezes. GI: Abdomen soft without rigidity.  No TTP. No ascites, distension, hepatosplenomegaly.   MSK: No deformities or effusions.  No clubbing/cyanosis.  Neuro: Negative Chvostek sign.  Cranial nervse normal, no nystagmus.  Figner to nose testing normal.  Mild tremor.  No asterixis.  Sensorium intact and responding to questions, attention normal.  Speech is fluent.  Hands appear weak and slightly spastic.  Arms have 4/5 and legs have 3/5 strength symmetrically.    Psych: Affect tired, blunted.  Judgment and insight appear normal.       Labs on Admission:  I have personally reviewed following labs and imaging studies: CBC:  Recent Labs Lab 01/26/16 2055  WBC 10.3  NEUTROABS 6.2  HGB 11.2*  HCT 30.2*  MCV 107.9*  PLT 208   Basic Metabolic Panel:  Recent Labs Lab 01/26/16 2055 01/26/16 2114  NA 134*  --   K 1.9*  --   CL 84*  --   CO2 31  --   GLUCOSE 134*  --   BUN 5*  --   CREATININE 0.55*  --   CALCIUM 7.0*  --   MG  --  1.0*  PHOS  --  2.7   GFR: Estimated Creatinine Clearance: 116.1 mL/min (by C-G formula based on SCr of 0.8 mg/dL).  Liver Function Tests:  Recent Labs Lab 01/26/16 2055  AST 137*  ALT 47  ALKPHOS 182*  BILITOT 1.4*  PROT 6.8  ALBUMIN 3.3*   No results for input(s): LIPASE, AMYLASE in the last 168 hours. No results for input(s): AMMONIA in the last 168 hours. Coagulation Profile: No results for input(s): INR, PROTIME in the last 168 hours. Cardiac Enzymes: No results for input(s): CKTOTAL, CKMB, CKMBINDEX, TROPONINI in the last 168 hours. BNP (last 3 results) No results for input(s): PROBNP in the last 8760 hours. HbA1C: No results for input(s): HGBA1C in the last 72 hours. CBG:  Recent Labs Lab 01/26/16 2106  GLUCAP 160*   Lipid Profile: No results for input(s): CHOL, HDL, LDLCALC, TRIG, CHOLHDL, LDLDIRECT in the last 72 hours. Thyroid Function Tests:  Recent Labs  01/26/16 2114  TSH 3.582  FREET4 1.06         EKG: Independently reviewed. Rate 135, QTc high normal 486.  No ST segment changes.    Assessment/Plan 1. Malnutrition: The patient has multiple complaints that appear at first to be related to malnutrition and electrolyte disturbances.  His neuropathy sounds as if it stretches back  longer than the last 2 months, and so perhaps this is related to diabetes, which runs in the family.   -If his symptoms do not improve markedly with electrolyte repletion and/or his other testing does not confirm the cause of neuropathy, will consult Neurology    1. Hypokalemia:  Suspect this is acute or subacute from alcohol use and poor intake.    Given 70 mEQ K in ER. -Give oral K 40 mEq TID  -Trend BMP -MIVF with K -Monitor on telemetry   2. Hypocalcemia and hypomagnesemia:  -Calcium gluconate now -Check ionized Ca -Give additional 2g mag sulfate now  3. Transaminitis:  Likely from alcohol. -Check acute hepatitis panel  4. Anion gap elevation without acidosis by BMP:  Infection is not present. -  Check lactic acid and osmolal gap  5. Alcohol use disorder, severe, desires to quit:  -Gabapentin 400 mg TID -CIWA scoring -Folate, thiamine, and MVI ordered -Consult to Social Work  6. Neuropathy:  Diabetes vs B12 deficiency.  HIV and syphilis doubted.  Primary neurologic disease doubted but within differential. -Check HgbA1c -Check B12 level -Check HIV and follow RPR  7. Vision loss:  Suspect this is from malnutrition. -Check vitamin A level -Consult to Ophthalmology, appreciate cares  8. Leg swelling: Suspect again this is from malnutrition.  DVT concern low but patient reports very sedentary -Will rule out DVT with ultrasound  9. Anemia, normocytic, unclear cause: -Check iron studies -Check folate and B12 -Check reticulocytes    DVT prophylaxis: Lovenox  Code Status: FULL  Family Communication:  None present  Disposition Plan: Anticipate close monitoring of electrolytes with repletion over next 2-3 days, follow testing for anemia, hepatitis, HIV, vision changes.  Monitor for signs of alcohol withdrawal.  Consult to SW for cessation services.  Consults called: None overnight Admission status: Inpatient, telemetry     Medical decision making: Patient seen  at 2:11 AM on 01/27/2016. What exists of the patient's chart was reviewed in depth.  Clinical condition: requiring telemetry monitoring and frequent lab draws for multiple severe electrolyte abnormalities.        YADEN SEITH [facility] Pager [phone]

## 2016-01-27 NOTE — Progress Notes (Signed)
**  Preliminary report by tech**  No evidence of deep vein or superficial thrombosis involving the right lower extremity and left lower extremity. All visualized vessels appear patent and compressible. No evidence of Baker's cysts bilaterally.   01/27/16 2:28 PM Olen Cordial RVT

## 2016-01-27 NOTE — ED Notes (Signed)
[facility] here to transport pt. 

## 2016-01-27 NOTE — Care Management Note (Addendum)
Case Management Note  Patient Details  Name: Albert Lara MRN: [id number] Date of Birth: [birth year]  Subjective/Objective:                 Spoke to patient in the room. He states that he moved to [facility] maybe Wednesday, or Thursday of this week to live with his cousin Albert Lara. He previously lived in [place]. He states that he has insurance that started Aug 1st that he signed up for over the phone, although he could not state the name of payor. He states that he has a doctor he that he goes to (although he just moved this week) but could not remember his name. Cm asked if he went to [facility], FM or IM clinics, he denied all of these. He does not use a pharmacy. He states that he has daughter that live in [place] but did not elaborate. Patient has ETOH history significant for a quart of vodka daily for several months and poor nutrition/ malnourishment. Critically low K, Ca, Mag. Replacing IV.  Spoke with patient's cousin Albert Lara cell [phone], work [phone]. She states that patient will be staying with her after DC. She states that she will be able to get patient after work around 6:00pm.She verified that she lives at [place] [address]. Patient will not have 24/7 supervision at home during the day when she is work. Spoke w/ Osborne Casco CSW about possibility of LOG. Patient has been ambulating in hallway without assistance of RW, and per prior conversation with PT patient although impulsive and unsteady is able to ambulate w/o assistive devices and able to balance himself to prevent fall. Patient not a SNF candidiate for observation/ supervision unless private pay. Patient to DC to home with [facility] RN for medication/ disease management as facilitated through charity care by [facility].   Addendum 02/01/16 [facility], and [facility] unable to provide [facility], [facility] may be able to provide [facility]. Patient moved to stepdown 2/2 hallucinations ETOH w/drawl. Dispo plan pending, will  need reevaluated due to change in patient condition.   Action/Plan:   Follow up made at [facility] Aug 10 at 3:00pm. Rx's Faxed. Patient may fill meds there at DC.   Expected Discharge Date:                  Expected Discharge Plan:     In-House Referral:     Discharge planning Services  CM Consult  Post Acute Care Choice:    Choice offered to:     DME Arranged:    DME Agency:     HH Arranged:    HH Agency:     Status of Service:  In process, will continue to follow  If discussed at Long Length of Stay Meetings, dates discussed:    Additional Comments:  Lawerance Sabal, RN 01/27/2016, 11:29 AM

## 2016-01-27 NOTE — Progress Notes (Signed)
CRITICAL VALUE ALERT  Critical value received:  6: 50  Date of notification:  01/27/16  Time of notification:  6:50  Critical value read back:Yes.    Nurse who received alert:  Garnette Gunner   MD notified (1st page):  Maryfrances Bunnell, MD  Time of first page:  6:52  MD notified (2nd page):  Time of second page:  Responding MD:  Maryfrances Bunnell, MD  Time MD responded:  6:53

## 2016-01-27 NOTE — Progress Notes (Signed)
CSW received consult regarding ETOH abuse. Patient stated that he drinks a "quarter" of alcohol every day. Pt stated that he is finding alcohol to be a problem in his life such as causing depression and interfering with daily responsibilities and his health. Pt states that he would like to cut back. CSW provided resources. Pt stated that his doctor told him he could detox on a medicine at the hospital. CSW encouraged pt to also review the provided resources for additional support to help cut back on the alcohol.   CSW signing off.  Albert Lara LCSWA [phone]

## 2016-01-27 NOTE — Progress Notes (Signed)
CRITICAL VALUE ALERT  Critical value received:  Lactic Acid 2.6  Date of notification:  01/27/2016  Time of notification:  0348  Critical value read back:Yes.    Nurse who received alert:  Sue Lush  MD notified (1st page):  Crosley  Time of first page:  [phone]  MD notified (2nd page): Danford  Time of second page:6:52  Responding MD:  Maryfrances Bunnell  Time MD responded:  6:53

## 2016-01-27 NOTE — Progress Notes (Signed)
Initial Nutrition Assessment  DOCUMENTATION CODES:   Non-severe (moderate) malnutrition in context of social or environmental circumstances  INTERVENTION:   -Boost Breeze po TID, each supplement provides 250 kcal and 9 grams of protein  NUTRITION DIAGNOSIS:   Malnutrition related to social / environmental circumstances as evidenced by mild depletion of muscle mass, mild depletion of body fat, energy intake < or equal to 75% for > or equal to 1 month.  GOAL:   Patient will meet greater than or equal to 90% of their needs   MONITOR:   PO intake, Supplement acceptance, Labs, Weight trends, Skin, I & O's  REASON FOR ASSESSMENT:   Malnutrition Screening Tool    ASSESSMENT:   Albert Lara is a 40 y.o. male with a past medical history significant for depression and alcohol use who presents with multiple complaints for 2 months.  Pt admitted with malnutrition and hypokalemia. Per H&P, pt consumes 1 quart of vodka daily.   Spoke with pt at bedside. Pt was in pain and was unable to provide a lot of nutrition hx. He reveals that food intake has been poor over the past year and he has received the majority of his nutrition via alcohol. He confirmed consuming 1 quart of vodka daily. He reveals that there are days where he does not consume any food. Observed breakfast tray- pt consumed 50% of eggs, 50% of grits, and 100% of fruit cup.   Pt reports UBW of 185#, which he last weighed several years ago. He estimates he has lost 17# (10.3%) over the past year, however, wt hx unavailable to confirm statements.   He reveals that his illness has impacted his activity level and he is mostly sedentary. He would bowl on a regular basis when in his usual state of health. Nutrition-Focused physical exam completed. Findings are mild fat depletion, mild muscle depletion, and no edema.   Pt reports he does not want to try Ensure, but is amenable to Boost Breeze. RD to order.   Medications  reviewed and include folvite, MVI, KCl, and vitamin B-1.   Labs reviewed: K: 3.0 (on supplementation).   Diet Order:  Diet regular Room service appropriate? Yes; Fluid consistency: Thin  Skin:  Reviewed, no issues  Last BM:  01/26/16  Height:   Ht Readings from Last 1 Encounters:  01/27/16 5\' 8"  (1.727 m)    Weight:   Wt Readings from Last 1 Encounters:  01/27/16 148 lb 5.9 oz (67.3 kg)    Ideal Body Weight:  70 kg  BMI:  Body mass index is 22.56 kg/m.  Estimated Nutritional Needs:   Kcal:  1800-2000  Protein:  90-105 grams  Fluid:  1.8-2.0 L  EDUCATION NEEDS:   No education needs identified at this time  Thana Ramp A. Mayford Knife, RD, LDN, CDE Pager: [phone] After hours Pager: [phone]

## 2016-01-27 NOTE — Progress Notes (Signed)
CRITICAL VALUE ALERT  Critical value received: Lactic acid 2.3  Date of notification: 01/27/16  Time of notification:  1316  Critical value read back:Yes.    Nurse who received alert:  Nena Polio  MD notified (1st page): Regalado  Time of first page:  1318  MD notified (2nd page):  Time of second page:  Responding MD:  Sunnie Nielsen  Time MD responded: 1318

## 2016-01-28 ENCOUNTER — Inpatient Hospital Stay (HOSPITAL_COMMUNITY): Payer: Self-pay

## 2016-01-28 LAB — BASIC METABOLIC PANEL
Anion gap: 9 (ref 5–15)
CALCIUM: 7 mg/dL — AB (ref 8.9–10.3)
CHLORIDE: 102 mmol/L (ref 101–111)
CO2: 24 mmol/L (ref 22–32)
CREATININE: 0.49 mg/dL — AB (ref 0.61–1.24)
GFR calc non Af Amer: >60 mL/min (ref >60)
Glucose, Bld: 83 mg/dL (ref 65–99)
Potassium: 4 mmol/L (ref 3.5–5.1)
Sodium: 135 mmol/L (ref 135–145)

## 2016-01-28 LAB — HEPATITIS PANEL, ACUTE
HCV AB: 0.3 {s_co_ratio} (ref 0.0–0.9)
HEP B S AG: NEGATIVE
Hep A IgM: NEGATIVE
Hep B C IgM: NEGATIVE

## 2016-01-28 LAB — CBC
HCT: 29.1 % — ABNORMAL LOW (ref 39.0–52.0)
Hemoglobin: 10.1 g/dL — ABNORMAL LOW (ref 13.0–17.0)
MCH: 40.4 pg — AB (ref 26.0–34.0)
MCHC: 34.7 g/dL (ref 30.0–36.0)
MCV: 116.4 fL — AB (ref 78.0–100.0)
PLATELETS: 165 10*3/uL (ref 150–400)
RBC: 2.5 MIL/uL — AB (ref 4.22–5.81)
RDW: 16 % — AB (ref 11.5–15.5)
WBC: 11 10*3/uL — ABNORMAL HIGH (ref 4.0–10.5)

## 2016-01-28 LAB — HEMOGLOBIN A1C
Hgb A1c MFr Bld: 4.9 % (ref 4.8–5.6)
Mean Plasma Glucose: 94 mg/dL

## 2016-01-28 LAB — SEDIMENTATION RATE: SED RATE: 22 mm/h — AB (ref 0–16)

## 2016-01-28 LAB — CALCIUM, IONIZED: CALCIUM, IONIZED, SERUM: 3.8 mg/dL — AB (ref 4.5–5.6)

## 2016-01-28 LAB — C-REACTIVE PROTEIN: CRP: 2.4 mg/dL — ABNORMAL HIGH (ref <1.0)

## 2016-01-28 LAB — CK: CK TOTAL: 242 U/L (ref 49–397)

## 2016-01-28 LAB — RPR: RPR: NONREACTIVE

## 2016-01-28 NOTE — Clinical Social Work Note (Signed)
Clinical Social Work Assessment  Patient Details  Name: Albert Lara MRN: [id number] Date of Birth: 1975/11/17  Date of referral:  01/28/16               Reason for consult:  Facility Placement, Substance Use/ETOH Abuse                Permission sought to share information with:  Case Manager, Magazine features editor, Family Supports Permission granted to share information::  Yes, Verbal Permission Granted  Name::        Agency::  none at this time.  Relationship::  friend in room, gives permission to assess in front of her  Contact Information:     Housing/Transportation Living arrangements for the past 2 months:  Apartment Source of Information:  Patient, Medical Team Patient Interpreter Needed:  None Criminal Activity/Legal Involvement Pertinent to Current Situation/Hospitalization:  No - Comment as needed Significant Relationships:  Friend, Other Family Members Lives with:  Relatives Do you feel safe going back to the place where you live?  Yes Need for family participation in patient care:  No (Coment)  Care giving concerns:  Patient reports he is living in the [place] currently moved from [facility], but plans to stay here.  Reports no concerns other than difficulty walking.  Reports he can manage on his own and does not want any physical therapy.  Social Worker assessment / plan:  LCSW received consult for current SA and SNF placement. Patient refuses placement at this time. Reports he can manage on his own and has family to help. Reports he has a Photographer and will go to [employer] with a friend to help him. Patient request information about alcohol rehab. LCSW gave outpatient information for [facility] and [facility] due to lack of insurance. Patient reports he has insurance, just signed up a day ago, but cannot given number or card for referrals. Patient reports he has valid transport and friend in room verifies housing and resources at home. Patient irritated  reporting, "who sent you in here, you are like the third person to come in and talk with me about discharge plans, are they kicking me out"?  LCSW verified orders and reason for consult. Patient understands and reports he is doing well and has no current needs.   Employment status:  Public affairs consultant information:  Self Pay (Medicaid Pending) (reports he just signed up for insurance, no proof of card on number) PT Recommendations:  Skilled Nursing Facility Information / Referral to community resources:  Outpatient Substance Abuse Treatment Options, Skilled Nursing Facility  Patient/Family's Response to care:  Not agreeable to SNF, reporting wanting to go home, hopefully next Wednesday.  Patient/Family's Understanding of and Emotional Response to Diagnosis, Current Treatment, and Prognosis:  Patient aware he has problem with walking and his legs, in denial regarding severity and reasons why.  Seems to be aware that alcohol is an issues as he requests about outpatient resources, but does not connect that etoh use could be triggering pain in legs/weakness.  Emotional Assessment Appearance:  Appears stated age Attitude/Demeanor/Rapport:  Guarded, Avoidant Affect (typically observed):  Irritable Orientation:  Oriented to Self, Oriented to Place, Oriented to  Time, Oriented to Situation Alcohol / Substance use:  Alcohol Use Psych involvement (Current and /or in the community):  No (Comment)  Discharge Needs  Concerns to be addressed:  No discharge needs identified Readmission within the last 30 days:  No Current discharge risk:  None Barriers to Discharge:  No  Barriers Identified   Will DC home when medically stable. [facility] has current SA resources for patient.   Raye Sorrow, LCSW 01/28/2016, 12:52 PM

## 2016-01-28 NOTE — Consult Note (Addendum)
Neurology Consult Note  Reason for Consultation: numbness in hands and vision loss for one month  Requesting provider: Belkys Regalado  CC: pain in hands and feet, can't see  HPI: This is a 40 year old right-handed man who reports that he has had pain in the hands and feet for about 2 months. He states that he notes some numbness in his hands that is essentially limited to the fingers. He indicates that the numbness extends from the metacarpophalangeal joints of each digit out to the tip of the digit. This has apparently been stable for the past 2 months. At the same time, he developed some numbness and discomfort in both legs. He states that he developed shooting pains that typically go down the outside of his lower leg from the need to the foot. This is associated with numbness. He also has extreme discomfort in both feet with pain precipitated by even the lightest of touches. He endorses weakness in both legs as well.  Regards to his vision, he initially told me that this is been present for the past 2 months as well. He later stated, however, that this actually started 1 or 2 months before the extremity symptoms. He describes loss of visual acuity in both eyes. There is no associated pain in the eyes. He has not had any visual field cuts or double vision.  He denies any other symptoms such as double vision, vertigo, difficulty talking, difficulty swallowing, or changes in bowel and bladder function. He states that his gait has been affected because of weakness and pain. This week, he has noticed increased weakness in his legs, significant enough that he is unable to climb stairs.  He endorses that he is a heavy drinker, drinking a half of a fifth of vodka every day. He was concerned that his symptoms were related to poor nutrition in the setting of heavy alcohol use. He indicates that he lost over 50 pounds because he was not eating. He denies any exposures to any chemicals. He denies any recent  infections. He was living with his mother in [place] until a few weeks ago when he moved to [place]. He has not had any injuries to the extremities or spine. He does not report any pain in the back or neck.  PMH:  1. Alcohol abuse 2. Depression  PSH:  Past Surgical History:  Procedure Laterality Date  . TONSILLECTOMY      Family history: Family History  Problem Relation Age of Onset  . Diabetes Mother     Social history:  He recently moved to [place] area from [place]. He is staying with family here in town. He is single. He smokes one pack of cigarettes daily. He drinks about a quart of folic acid every day. He denies any illicit drug use. He is currently not working.   Current inpatient meds:  Current Facility-Administered Medications  Medication Dose Route Frequency Provider Last Rate Last Dose  . 0.9 %  sodium chloride infusion   Intravenous Continuous Belkys A Regalado, MD 50 mL/hr at 01/27/16 2222    . acetaminophen (TYLENOL) tablet 650 mg  650 mg Oral Q6H PRN Edwin Dada, MD       Or  . acetaminophen (TYLENOL) suppository 650 mg  650 mg Rectal Q6H PRN Edwin Dada, MD      . calcium carbonate (OS-CAL - dosed in mg of elemental calcium) tablet 500 mg of elemental calcium  1 tablet Oral BID WC Elmarie Shiley, MD  500 mg of elemental calcium at 01/28/16 1700  . ciprofloxacin (CILOXAN) 0.3 % ophthalmic solution 1 drop  1 drop Both Eyes Q4H while awake Belkys A Regalado, MD   1 drop at 01/28/16 1701  . enoxaparin (LOVENOX) injection 40 mg  40 mg Subcutaneous Q24H Edwin Dada, MD   40 mg at 01/28/16 1041  . famotidine (PEPCID) tablet 20 mg  20 mg Oral BID Willia Craze, NP   20 mg at 01/28/16 1041  . feeding supplement (BOOST / RESOURCE BREEZE) liquid 1 Container  1 Container Oral TID BM Belkys A Regalado, MD   1 Container at 01/28/16 1700  . folic acid (FOLVITE) tablet 1 mg  1 mg Oral Daily Edwin Dada, MD   1 mg at 01/28/16 1040   . gabapentin (NEURONTIN) capsule 400 mg  400 mg Oral TID Edwin Dada, MD   400 mg at 01/28/16 1700  . LORazepam (ATIVAN) tablet 1 mg  1 mg Oral Q6H PRN Willia Craze, NP   1 mg at 01/28/16 1320  . multivitamin with minerals tablet 1 tablet  1 tablet Oral Daily Edwin Dada, MD   1 tablet at 01/28/16 1041  . pneumococcal 23 valent vaccine (PNU-IMMUNE) injection 0.5 mL  0.5 mL Intramuscular Tomorrow-1000 Edwin Dada, MD      . sodium chloride flush (NS) 0.9 % injection 3 mL  3 mL Intravenous Q12H Edwin Dada, MD   3 mL at 01/28/16 1040  . thiamine (VITAMIN B-1) tablet 100 mg  100 mg Oral Daily Edwin Dada, MD   100 mg at 01/28/16 1040  . traMADol (ULTRAM) tablet 50 mg  50 mg Oral Q6H PRN Belkys A Regalado, MD   50 mg at 01/28/16 1700  . vitamin B-12 (CYANOCOBALAMIN) tablet 100 mcg  100 mcg Oral Daily Belkys A Regalado, MD   100 mcg at 01/28/16 1040    Allergies: No Known Allergies  ROS: As per HPI. A full 14-point review of systems was performed and is otherwise notable for cramping in both legs. He also states he has occasional tremors in the arms.  PE:  BP (!) 124/97 (BP Location: Left Arm)   Pulse (!) 123   Temp 98.5 F (36.9 C) (Oral)   Resp 19   Ht 5' 8"  (1.727 m)   Wt 67.3 kg (148 lb 5.9 oz)   SpO2 100%   BMI 22.56 kg/m   General: WDWN, tired appearing. AAO x4. Speech clear, no dysarthria. No aphasia. Follows commands briskly. Affect is bright with congruent mood. Comportment is normal.  HEENT: Normocephalic. Neck supple without LAD. MMM, OP clear. Dentition poor. Sclerae anicteric. No conjunctival injection.  CV: Regular, no murmur. Carotid pulses full and symmetric, no bruits. Distal pulses 2+ and symmetric.  Lungs: CTAB.  Abdomen: Soft, non-distended, non-tender. Bowel sounds present x4.  Extremities: No C/C/E. Neuro:  CN: Pupils are equal and round. They are symmetrically reactive from 3-->2 mm. EOMI without nystagmus. No  reported diplopia. Facial sensation is intact to light touch. Face is symmetric at rest with normal strength and mobility. Hearing is intact to conversational voice. Palate elevates symmetrically and uvula is midline. Voice is normal in tone, pitch and quality. Bilateral SCM and trapezii are 5/5. Tongue is midline with normal bulk and mobility.  Motor: Normal bulk, tone, and strength. He has some give way in both lower chimneys because of pain below the knee. This limits dorsiflexion and plantar flexion in particular.  No tremor or other abnormal movements. No drift.  Sensation: Intact to light touch. Pinprick is diminished over both forearms and hands as well as over both lower legs from knee down. He has market allodynia over the feet. Vibration and joint position could not be tested because of his allodynia. DTRs: 2+, symmetric with trace ankle jerks bilaterally. Plantar reflexes could not be tested because of his allodynia. No pathologic reflexes.  Coordination: Finger-to-nose is without dysmetria. Heel-to-shin is very slow and somewhat inhibited by pain in his feet and legs but no obvious dysmetria. Finger taps are slow bilaterally. Gait: Romberg negative. He is able to take a few steps with a wide base. He walks gingerly because of pain in his feet.   Labs:  Lab Results  Component Value Date   WBC 11.0 (H) 01/28/2016   HGB 10.1 (L) 01/28/2016   HCT 29.1 (L) 01/28/2016   PLT 165 01/28/2016   GLUCOSE 83 01/28/2016   ALT 41 01/27/2016   AST 122 (H) 01/27/2016   NA 135 01/28/2016   K 4.0 01/28/2016   CL 102 01/28/2016   CREATININE 0.49 (L) 01/28/2016   BUN <5 (L) 01/28/2016   CO2 24 01/28/2016   TSH 3.334 01/27/2016   HGBA1C 4.9 01/27/2016   CRP 2.4 ESR 22 Lactate 2.3, down from 2.6 on 01/27/16 Ferritin 1915 Vitamin B12 302 Iron 155 TIBC 171 HIV antibody nonreactive Acute hepatitis panel negative RPR nonreactive Vitamin B1 pending Folate pending Vitamin a pending Methylmalonic  acid pending Vitamin D 25 pending Initial potassium of 8.3/17 critically low at 1.9 initial calcium 7.0   Imaging: No imaging for review.  Assessment and Plan:  1. Peripheral neuropathy: He demonstrates a painful length dependent sensory polyneuropathy involving all 4 extremities, worse in the legs. There is significant allodynia in both feet. This may be attributable to his chronic alcohol use as alcohol is a known toxin for the peripheral nerves and is a well known cause of painful peripheral neuropathy. He also has evidence of inflammation given elevated ESR and CRP as well as markedly elevated ferritin levels. Other potential considerations could include vitamin B6 deficiency, heavy metal toxicity, connective tissue disease, vasculitis, cryoglobulinemia, gammopathy, and sarcoidosis. I will check vitamin B6 level, urine heavy metal screen, ANA, anti-SSA/SSB, rheumatoid factor, serum cryoglobulins, ANCA panel, SPEP, CK and serum ACE level. B12 is in the low normal range which could be enough to cause symptoms in some individuals. MMA is pending but I would favor initiation of B12 supplementation regardless. Given his marked allodynia, he may benefit from medication directed at neuropathic pain such as a tricyclic antidepressant or gabapentin. PT.   2. Vision loss: This is concerning for bilateral optic neuropathy. Differential considerations share some overlap with those for painful peripheral neuropathy and include sarcoidosis, connective tissue disease, heavy metal toxicity, B6 deficiency, B12 deficiency, ethanol toxicity, and vasculitis. Labs will be ordered as above.

## 2016-01-28 NOTE — Evaluation (Signed)
Physical Therapy Evaluation Patient Details Name: Albert Lara MRN: [id number] DOB: [birth year] Today's Date: 01/28/2016   History of Present Illness  Albert Lara a 40 y.o.malewith a past medical history significant for depression and alcohol use. Pt admitted with bilateral leg numbness and weakness, shooting severe pains in both calves, numbness/paresthesias in both fingers/hands, loss of vision.  Clinical Impression  Pt admitted with the above. Presents with deficit of decreased strength, sensation, mobility, balance, cognition, safety awareness, and increased pain. Pt chief complaints inconsistent with functional mobility. Despite pt report of 10/10 pain in bilat LE, pt with no antalgic gait. However, demo'd decreased sequencing ability to manage walker and stepping pattern, requiring max verbal cues. Lives at home with cousin who is not able to provide level of assistance necessary at this time. PT recommend SNF to address deficits to achieve mod I for safe return home. Continue acute follow.    Follow Up Recommendations SNF;Supervision/Assistance - 24 hour    Equipment Recommendations   (TBD at next location)    Recommendations for Other Services       Precautions / Restrictions Precautions Precautions: Fall Restrictions Weight Bearing Restrictions: No      Mobility  Bed Mobility Overal bed mobility: Needs Assistance Bed Mobility: Supine to Sit     Supine to sit: Mod assist     General bed mobility comments: Pt required bilat UE assist to sit up at EOB with HOB elevated. Able to manage LEs with cues for hip scooting toward EOB.  Transfers Overall transfer level: Needs assistance Equipment used: 1 person hand held assist Transfers: Sit to/from Stand Sit to Stand: Mod assist         General transfer comment: Mod A for power up and to maintain balance while standing.  Ambulation/Gait Ambulation/Gait assistance: Min assist;Mod assist (Mod A for 1  hand held assist, min A for use of RW) Ambulation Distance (Feet): 20 Feet Assistive device: Rolling walker (2 wheeled);1 person hand held assist Gait Pattern/deviations: Step-through pattern;Decreased stride length;Scissoring   Gait velocity interpretation: Below normal speed for age/gender General Gait Details:  Pt with reported increase of LE pain but no signs of antalgic gait although pt did have increased bilat UE WB. Pt requiring mod A for balance with HHA. Pt more steady with use of RW with min A for balance and walker management. Due to decreased sequencing, required cues for walking management and stepping pattern. LOB x 2 with max A for recovery and RW management. HR at 127 bpm at start of session, rose to 141 bpm at highest during ambulation, returned down to 135 bpm while sitting.  Stairs            Wheelchair Mobility    Modified Rankin (Stroke Patients Only)       Balance Overall balance assessment: Needs assistance Sitting-balance support: Feet unsupported;No upper extremity supported Sitting balance-Leahy Scale: Fair     Standing balance support: Bilateral upper extremity supported;During functional activity Standing balance-Leahy Scale: Poor                               Pertinent Vitals/Pain Pain Assessment: Faces Faces Pain Scale: Hurts whole lot Pain Location: various points bilateral LE including R great toe, L foot and down his calf, as well as bilat hands Pain Descriptors / Indicators: Sharp;Shooting;Numbness;Tingling Pain Intervention(s): Monitored during session;Repositioned    Home Living Family/patient expects to be discharged to:: Private residence Living Arrangements: Other  relatives (Cousin and cousin's son) Available Help at Discharge: Family (Pt reports he only sees his cousin "twice a day") Type of Home: Apartment Home Access: Stairs to enter Entrance Stairs-Rails: Right (one rail for first flight) Secretary/administrator of  Steps: 40 Home Layout: One level Home Equipment: Grab bars - tub/shower      Prior Function Level of Independence: Independent         Comments: Pt was driving before hospitalization     Hand Dominance   Dominant Hand: Right    Extremity/Trunk Assessment   Upper Extremity Assessment: RUE deficits/detail;LUE deficits/detail RUE Deficits / Details: decreased grip strength, generalized weakness with numbness and tingling in hands and fingers   RUE Sensation: history of peripheral neuropathy;decreased light touch LUE Deficits / Details: decrease grip strength, generalized weakness with numbenes and tingling in hands and fingers   Lower Extremity Assessment: RLE deficits/detail;LLE deficits/detail RLE Deficits / Details: generalized weakness, not formally assessed due to shooting pain in LE LLE Deficits / Details: generalized weakness, not formally assessed due to shooting pain in LE     Communication   Communication: No difficulties  Cognition Arousal/Alertness: Awake/alert Behavior During Therapy: WFL for tasks assessed/performed Overall Cognitive Status: Impaired/Different from baseline Area of Impairment: Safety/judgement;Problem solving         Safety/Judgement: Decreased awareness of safety;Decreased awareness of deficits   Problem Solving: Slow processing;Decreased initiation;Requires verbal cues General Comments: Pt pushing walker continuously in front of him but required cues to move feet    General Comments General comments (skin integrity, edema, etc.): Pt with blurry vision long distance, and decreased peripheral vision in bilat upper quadrant. Pt asking if "that is a sandwich" on the tray infront of him. The object pt asking about was piece of white paper.    Exercises        Assessment/Plan    PT Assessment Patient needs continued PT services  PT Diagnosis Difficulty walking;Abnormality of gait;Generalized weakness   PT Problem List Decreased  strength;Decreased range of motion;Decreased activity tolerance;Decreased balance;Decreased mobility;Decreased coordination;Decreased cognition;Decreased knowledge of use of DME;Decreased safety awareness;Impaired sensation;Pain  PT Treatment Interventions DME instruction;Gait training;Stair training;Functional mobility training;Therapeutic activities;Therapeutic exercise;Balance training;Neuromuscular re-education;Cognitive remediation;Patient/family education   PT Goals (Current goals can be found in the Care Plan section) Acute Rehab PT Goals Patient Stated Goal: to walk and to get better PT Goal Formulation: With patient Time For Goal Achievement: 02/04/16 Potential to Achieve Goals: Good    Frequency Min 2X/week   Barriers to discharge        Co-evaluation               End of Session Equipment Utilized During Treatment: Gait belt Activity Tolerance: Patient limited by pain Patient left: in chair;with call bell/phone within reach;with chair alarm set           Time: 6270-3500 PT Time Calculation (min) (ACUTE ONLY): 40 min   Charges:   PT Evaluation $PT Eval Moderate Complexity: 1 Procedure PT Treatments $Gait Training: 8-22 mins $Therapeutic Activity: 8-22 mins   PT G Codes:        Peja Allender 2016/02/13, 11:01 AM  Park Liter, SPT (student physical therapist) Acute Rehabilitation Services [phone]

## 2016-01-28 NOTE — Progress Notes (Signed)
PROGRESS NOTE    Albert Lara  FWY:[id number] DOB: 16-Mar-1976 DOA: 01/26/2016 PCP: No primary care provider on file.    Brief Narrative: Albert Lara is a 40 y.o. male with a past medical history significant for depression and alcohol use who presents with multiple complaints for 2 months.  The patient has a hard time specifying the chronicity of his ailments, but somewhere over the last 2 months, he has had worsening bilateral leg numbness, progressing to shooting severe pains in both calves, numbness/paresthesias in both fingers/hands, loss of vision ("hazy" or "blurry" vision), and leg weakness to the point that he can't climb stairs this week.  He had been living with his mother in [place] until a few weeks ago, when he came to [place].  He has been drinking up to 1 quart of vodka daily for many months, "I barely eat" and had lost >50 lbs.  Does not use injection drugs.  No previous medical complaints, no daily medications, remote history of depression.  Tonight, a family member taking care of him here in [place] made him come to the ER    Assessment & Plan:   Principal Problem:   Acute hypokalemia Active Problems:   Hypomagnesemia   Abnormal levels of other serum enzymes   Vision changes   Hypocalcemia   Alcohol use disorder, severe, dependence (HCC)   Leg swelling   Peripheral neuropathy (HCC)   Hypokalemia   Malnutrition of moderate degree  1.  Neuropathy: unclear etiology  -Hgb-A1c 4.9, normal. - B12 level low normal, check MMA. Start supplements.  - HIV negative and  RPR negative,.  - Vitamin D pending.  -TSH normal.  -will also check, ERS, CRP.  -neurology consulted.   1. Hypokalemia:  Suspect this is acute or subacute from alcohol use and poor intake.    Given 70 mEQ K in ER. Resolved.   2. Hypocalcemia and hypomagnesemia:  received calcium and mg supplement.  calcium corrected by albumin 7.6. Will start oral supplement   3. Transaminitis:    Likely from alcohol. -acute hepatitis panel negative and [place] hepatomegalia.   4. Anion gap elevation without acidosis by BMP:  Infection is not present. -Check lactic acid and osmolal gap  5. Alcohol use disorder, severe, desires to quit:  -Gabapentin 400 mg TID -CIWA scoring -Folate, thiamine, and MVI ordered -Consult to Social Work  6. Malnutrition: The patient has multiple complaints that appear at first to be related to malnutrition and electrolyte disturbances.  His neuropathy sounds as if it stretches back longer than the last 2 months, and so perhaps this is related to diabetes, which runs in the family.   -If his symptoms do not improve markedly with electrolyte repletion and/or his other testing does not confirm the cause of neuropathy, will need to consult Neurology  7. Vision loss:  Suspect this is from malnutrition. Unclear etiology  -vitamin A level, thiamine, folic acid, pending  opthalmology consulted. Dr Charlotte Sanes recommend to check vitamin level and patient to follow in the office on Monday.  Lipase normal.  conjunctivitis; start cipro.   8. Leg swelling: Suspect again this is from malnutrition.   Doppler negative for DVT  Uric acid negative.   9. Anemia, normocytic, unclear cause: --iron 155, ferritin 1915 -folate pending. and B-12 low normal, check MMA.  -started  B 12 supplement.       DVT prophylaxis: lovenox.  Code Status: full code.  Family Communication: care discussed with patient.  Disposition Plan: to be determine  Consultants:   Opthalmology Dr Charlotte Sanes , recommend vitamin B 12, thiamine, PRP and patient to follow in her office next week.   Neurology     Procedures:   Doppler lower extremities negative for DVT   Antimicrobials:   none   Subjective: He is feeling better, more energy, blurry vision better, but still can not read from distance.  He is still complaining of lower extremities pain, sharp, numbness tingling.   Relates tingling in his fingers    Objective: Vitals:   01/27/16 0502 01/27/16 1525 01/27/16 2156 01/28/16 0515  BP: 119/83 115/89 (!) 125/94 (!) 123/98  Pulse: (!) 105 (!) 108 (!) 106 (!) 116  Resp: 18 18 17 17   Temp: 98.4 F (36.9 C) 98.3 F (36.8 C) 98.5 F (36.9 C) 98.4 F (36.9 C)  TempSrc:  Oral Oral Oral  SpO2: 94% 98% 98% 95%  Weight:      Height:        Intake/Output Summary (Last 24 hours) at 01/28/16 1044 Last data filed at 01/28/16 0740  Gross per 24 hour  Intake              300 ml  Output             3125 ml  Net            -2825 ml   Filed Weights   01/26/16 2014 01/27/16 0400  Weight: 66.2 kg (146 lb) 67.3 kg (148 lb 5.9 oz)    Examination:  General exam: Appears calm and comfortable  Respiratory system: Clear to auscultation. Respiratory effort normal. Cardiovascular system: S1 & S2 heard, RRR. No JVD, murmurs, rubs, gallops or clicks. No pedal edema. Gastrointestinal system: Abdomen is nondistended, soft and nontender. No organomegaly or masses felt. Normal bowel sounds heard. Central nervous system: Alert and oriented. No focal neurological deficits. Extremities: Symmetric 5 x 5 power. Skin: No rashes, lesions or ulcers Psychiatry: Judgement and insight appear normal. Mood & affect appropriate.     Data Reviewed: I have personally reviewed following labs and imaging studies  CBC:  Recent Labs Lab 01/26/16 2055 01/27/16 0603 01/28/16 0841  WBC 10.3 11.2* 11.0*  NEUTROABS 6.2  --   --   HGB 11.2* 9.9* 10.1*  HCT 30.2* 28.5* 29.1*  MCV 107.9* 112.6* 116.4*  PLT 208 142* 165   Basic Metabolic Panel:  Recent Labs Lab 01/26/16 2055 01/26/16 2114 01/27/16 0603 01/27/16 1654 01/28/16 0841  NA 134*  --  136 136 135  K 1.9*  --  3.0* 3.9 4.0  CL 84*  --  92* 100* 102  CO2 31  --  30 28 24   GLUCOSE 134*  --  99 91 83  BUN 5*  --  <5* <5* <5*  CREATININE 0.55*  --  0.66 0.49* 0.49*  CALCIUM 7.0*  --  6.6* 6.6* 7.0*  MG  --  1.0*   --   --   --   PHOS  --  2.7  --   --   --    GFR: Estimated Creatinine Clearance: 118 mL/min (by C-G formula based on SCr of 0.8 mg/dL). Liver Function Tests:  Recent Labs Lab 01/26/16 2055 01/27/16 0603  AST 137* 122*  ALT 47 41  ALKPHOS 182* 181*  BILITOT 1.4* 1.4*  PROT 6.8 5.8*  ALBUMIN 3.3* 2.8*    Recent Labs Lab 01/27/16 1654  LIPASE 43  AMYLASE 98   No results for input(s): AMMONIA in the last  168 hours. Coagulation Profile: No results for input(s): INR, PROTIME in the last 168 hours. Cardiac Enzymes: No results for input(s): CKTOTAL, CKMB, CKMBINDEX, TROPONINI in the last 168 hours. BNP (last 3 results) No results for input(s): PROBNP in the last 8760 hours. HbA1C:  Recent Labs  01/27/16 0248  HGBA1C 4.9   CBG:  Recent Labs Lab 01/26/16 2106 01/27/16 2013  GLUCAP 160* 132*   Lipid Profile: No results for input(s): CHOL, HDL, LDLCALC, TRIG, CHOLHDL, LDLDIRECT in the last 72 hours. Thyroid Function Tests:  Recent Labs  01/26/16 2114 01/27/16 0248  TSH 3.582 3.334  FREET4 1.06  --    Anemia Panel:  Recent Labs  01/27/16 0235 01/27/16 0603  VITAMINB12 302  --   FERRITIN 1,915*  --   TIBC 171*  --   IRON 155  --   RETICCTPCT  --  2.0   Sepsis Labs:  Recent Labs Lab 01/27/16 0235 01/27/16 0606 01/27/16 1135  LATICACIDVEN 2.6* 2.6* 2.3*    No results found for this or any previous visit (from the past 240 hour(s)).       Radiology Studies: US Abdomen Limited Ruq  Result Date: 01/28/2016 CLINICAL DATA:  40 year old male EXAM: US ABDOMEN LIMITED - RIGHT UPPER QUADRANT COMPARISON:  None. FINDINGS: Gallbladder: Lumen of the gallbladder is approximately 50% occupied with reflective microlithiasis/ sludge at the dependent aspects. Mild wall thickening with trace pericholecystic fluid. Negative sonographic Murphy's sign. Common bile duct: Diameter: 3 mm-4 mm Liver: Enlarged cranial caudal span of the liver with relatively  heterogeneous echotexture. IMPRESSION: Cholelithiasis with approximately 50% of the lumen of the gallbladder replaced with microlithiasis/sludge. No sonographic evidence of acute cholecystitis. If there is ongoing concern for acute cholecystitis, correlation with lab values, presentation, and HIDA study is recommended. Hepatomegaly Signed, Yvone Neu. Loreta Ave, DO [facility] Electronically Signed   By: Gilmer Mor D.O.   On: 01/28/2016 10:22        Scheduled Meds: . calcium carbonate  1 tablet Oral BID WC  . ciprofloxacin  1 drop Both Eyes Q4H while awake  . enoxaparin (LOVENOX) injection  40 mg Subcutaneous Q24H  . famotidine  20 mg Oral BID  . feeding supplement  1 Container Oral TID BM  . folic acid  1 mg Oral Daily  . gabapentin  400 mg Oral TID  . multivitamin with minerals  1 tablet Oral Daily  . pneumococcal 23 valent vaccine  0.5 mL Intramuscular Tomorrow-1000  . sodium chloride flush  3 mL Intravenous Q12H  . thiamine  100 mg Oral Daily  . vitamin B-12  100 mcg Oral Daily   Continuous Infusions: . sodium chloride 50 mL/hr at 01/27/16 2222     LOS: 1 day    Time spent: 35 minutes.     Alba Cory, MD [facility] Pager [phone]  If 7PM-7AM, please contact night-coverage [email or web address] Password [email or web address] 01/28/2016, 10:44 AM

## 2016-01-28 NOTE — Progress Notes (Signed)
Occupational Therapy Evaluation Patient Details Name: Albert Lara MRN: [id number] DOB: [birth year] Today's Date: 01/28/2016    History of Present Illness Albert Lara a 40 y.o.malewith a past medical history significant for depression and alcohol use. Pt admitted with bilateral leg numbness and weakness, shooting severe pains in both calves, numbness/paresthesias in both fingers/hands, loss of vision.   Clinical Impression   PTA, pt was independent with ADLs and mobility. Pt currently presents with blurry vision, generalized weakness with resulting balance impairments, cognitive deficits and BLE pain. Pt required min assist with seated LB ADLs due to pain and declined to mobilize further than sitting EOB due to pain and fatigue. Pt will benefit from continued acute OT to increase independence and safety with ADLs and mobility to allow for safe discharge to the venue listed below. Recommend SNF for post-acute rehab due to significant weakness and decreased social support at home.    Follow Up Recommendations  SNF;Supervision/Assistance - 24 hour    Equipment Recommendations  Other (comment) (TBD at next venue)    Recommendations for Other Services       Precautions / Restrictions Precautions Precautions: Fall Restrictions Weight Bearing Restrictions: No      Mobility Bed Mobility Overal bed mobility: Needs Assistance Bed Mobility: Supine to Sit;Sit to Supine     Supine to sit: Min assist Sit to supine: Min guard   General bed mobility comments: Assist for trunk support to come to sitting position. VCs for hand placement.  Transfers                 General transfer comment: Pt declined to mobilize further than EOB due to increased pain and fatigue.    Balance Overall balance assessment: Needs assistance Sitting-balance support: No upper extremity supported;Feet supported Sitting balance-Leahy Scale: Good                                       ADL Overall ADL's : Needs assistance/impaired Eating/Feeding: Set up;Sitting   Grooming: Set up;Sitting   Upper Body Bathing: Set up;Sitting   Lower Body Bathing: Minimal assistance;Sit to/from stand   Upper Body Dressing : Set up;Sitting   Lower Body Dressing: Minimal assistance;Sit to/from stand                 General ADL Comments: Pt declined to mobilize further than sitting EOB     Vision Vision Assessment?: Yes Eye Alignment: Within Functional Limits Ocular Range of Motion: Within Functional Limits Alignment/Gaze Preference: Within Defined Limits Tracking/Visual Pursuits: Able to track stimulus in all quads without difficulty Saccades: Within functional limits Convergence: Within functional limits Additional Comments: Pt reports it is easier to read print farther away (able to read some words on white board across from bed) rather than small print up close on his phone.   Perception     Praxis      Pertinent Vitals/Pain Pain Assessment: Faces Faces Pain Scale: Hurts even more Pain Location: BLE Pain Descriptors / Indicators: Sharp;Shooting Pain Intervention(s): Monitored during session     Hand Dominance Right   Extremity/Trunk Assessment Upper Extremity Assessment Upper Extremity Assessment: Generalized weakness (reports numbness in all fingertips)   Lower Extremity Assessment Lower Extremity Assessment: Defer to PT evaluation       Communication Communication Communication: No difficulties   Cognition Arousal/Alertness: Awake/alert Behavior During Therapy: WFL for tasks assessed/performed Overall Cognitive Status: Impaired/Different from baseline Area of  Impairment: Memory;Safety/judgement;Problem solving     Memory: Decreased short-term memory   Safety/Judgement: Decreased awareness of safety;Decreased awareness of deficits   Problem Solving: Slow processing;Decreased initiation;Requires verbal cues General Comments: Pt  reported different answers for home information to PT vs OT. Decreased safety awareness and slow processing.   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 [place]    OT Diagnosis: Generalized weakness;Cognitive deficits;Acute pain;Disturbance of vision   OT Problem List: Decreased strength;Decreased activity tolerance;Impaired vision/perception;Impaired balance (sitting and/or standing);Decreased safety awareness;Decreased knowledge of precautions;Decreased knowledge of use of DME or AE;Impaired sensation;Pain   OT Treatment/Interventions: Self-care/ADL training;Therapeutic exercise;DME and/or AE instruction;Therapeutic activities;Patient/family education;Balance training;Visual/perceptual remediation/compensation    OT Goals(Current goals can be found in the care plan section) Acute Rehab OT Goals Patient Stated Goal: "to get back to my kids in [place], [place], go back to work, and stop drinking" OT Goal Formulation: With patient Time For Goal Achievement: 02/11/16 Potential to Achieve Goals: Good ADL Goals Pt Will Perform Grooming: with modified independence;standing Pt Will Perform Upper Body Bathing: with modified independence;sitting Pt Will Perform Lower Body Bathing: with modified independence;sit to/from stand Pt Will Transfer to Toilet: with modified independence;ambulating;regular height toilet Pt Will Perform  Toileting - Clothing Manipulation and hygiene: with modified independence;sit to/from stand Pt/caregiver will Perform Home Exercise Program: Increased strength;Both right and left upper extremity;With theraband;With written HEP provided;Independently  OT Frequency: Min 2X/week   Barriers to D/C:            Co-evaluation              End of Session Nurse Communication: Mobility status  Activity Tolerance: Patient limited by pain;Patient limited by fatigue Patient left: in bed;with bed alarm set;with call bell/phone within reach   Time: 1107-1131 OT Time Calculation (min): 24 min Charges:  OT General Charges $OT Visit: 1 Procedure OT Evaluation $OT Eval Moderate Complexity: 1 Procedure OT Treatments $Self Care/Home Management : 8-22 mins G-Codes:    Nils Pyle, OTR/L Pager: [phone] 01/28/2016, 12:58 PM

## 2016-01-29 LAB — BASIC METABOLIC PANEL
ANION GAP: 8 (ref 5–15)
BUN: <5 mg/dL — ABNORMAL LOW (ref 6–20)
CHLORIDE: 101 mmol/L (ref 101–111)
CO2: 26 mmol/L (ref 22–32)
Calcium: 7.8 mg/dL — ABNORMAL LOW (ref 8.9–10.3)
Creatinine, Ser: 0.51 mg/dL — ABNORMAL LOW (ref 0.61–1.24)
GFR calc Af Amer: >60 mL/min (ref >60)
GFR calc non Af Amer: >60 mL/min (ref >60)
GLUCOSE: 87 mg/dL (ref 65–99)
POTASSIUM: 3.8 mmol/L (ref 3.5–5.1)
Sodium: 135 mmol/L (ref 135–145)

## 2016-01-29 LAB — CBC
HEMATOCRIT: 28.9 % — AB (ref 39.0–52.0)
Hemoglobin: 9.7 g/dL — ABNORMAL LOW (ref 13.0–17.0)
MCH: 38.6 pg — ABNORMAL HIGH (ref 26.0–34.0)
MCHC: 33.6 g/dL (ref 30.0–36.0)
MCV: 115.1 fL — AB (ref 78.0–100.0)
Platelets: 174 10*3/uL (ref 150–400)
RBC: 2.51 MIL/uL — AB (ref 4.22–5.81)
RDW: 15.6 % — ABNORMAL HIGH (ref 11.5–15.5)
WBC: 11.7 10*3/uL — AB (ref 4.0–10.5)

## 2016-01-29 LAB — MAGNESIUM: Magnesium: 1.3 mg/dL — ABNORMAL LOW (ref 1.7–2.4)

## 2016-01-29 MED ORDER — POLYVINYL ALCOHOL 1.4 % OP SOLN
1.0000 [drp] | OPHTHALMIC | Status: DC | PRN
  Filled 2016-01-29: qty 15

## 2016-01-29 MED ORDER — CARVEDILOL 3.125 MG PO TABS: 3.1250 mg | ORAL_TABLET | Freq: Two times a day (BID) | ORAL | Status: DC

## 2016-01-29 MED ORDER — VITAMIN B-12 1000 MCG PO TABS
1000.0000 ug | ORAL_TABLET | Freq: Every day | ORAL | Status: DC
  Administered 2016-01-30 – 2016-02-02 (×4): 1000 ug via ORAL
  Filled 2016-01-29 (×4): qty 1

## 2016-01-29 MED ORDER — MAGNESIUM SULFATE 2 GM/50ML IV SOLN
2.0000 g | Freq: Once | INTRAVENOUS | Status: AC
  Administered 2016-01-29: 2 g via INTRAVENOUS
  Filled 2016-01-29: qty 50

## 2016-01-29 MED ORDER — AMITRIPTYLINE HCL 25 MG PO TABS
25.0000 mg | ORAL_TABLET | Freq: Every day | ORAL | Status: DC
  Administered 2016-01-29 – 2016-02-01 (×4): 25 mg via ORAL
  Filled 2016-01-29 (×5): qty 1

## 2016-01-29 NOTE — Progress Notes (Signed)
Neurology Progress Note  Subjective: He reports that he slept poorly last night because of pain in his feet. He was unable to tolerate the sheet touching his feet. He eventually found some relief by elevating his feet on several towels and a pillow. Pain remains confined to his hands and feet. No new concerns are reported this morning other than being bored here in the hospital. He is eating and voiding without difficulty.   Current Meds:   Current Facility-Administered Medications:  .  acetaminophen (TYLENOL) tablet 650 mg, 650 mg, Oral, Q6H PRN, 650 mg at 01/29/16 0839 **OR** acetaminophen (TYLENOL) suppository 650 mg, 650 mg, Rectal, Q6H PRN, Alberteen Sam, MD .  calcium carbonate (OS-CAL - dosed in mg of elemental calcium) tablet 500 mg of elemental calcium, 1 tablet, Oral, BID WC, Belkys A Regalado, MD, 500 mg of elemental calcium at 01/29/16 0838 .  ciprofloxacin (CILOXAN) 0.3 % ophthalmic solution 1 drop, 1 drop, Both Eyes, Q4H while awake, Belkys A Regalado, MD, 1 drop at 01/29/16 0839 .  enoxaparin (LOVENOX) injection 40 mg, 40 mg, Subcutaneous, Q24H, Alberteen Sam, MD, 40 mg at 01/29/16 0837 .  famotidine (PEPCID) tablet 20 mg, 20 mg, Oral, BID, Meredith Pel, NP, 20 mg at 01/29/16 1610 .  feeding supplement (BOOST / RESOURCE BREEZE) liquid 1 Container, 1 Container, Oral, TID BM, Belkys A Regalado, MD, 1 Container at 01/29/16 1000 .  folic acid (FOLVITE) tablet 1 mg, 1 mg, Oral, Daily, Alberteen Sam, MD, 1 mg at 01/29/16 [phone] .  gabapentin (NEURONTIN) capsule 400 mg, 400 mg, Oral, TID, Alberteen Sam, MD, 400 mg at 01/29/16 5409 .  LORazepam (ATIVAN) tablet 1 mg, 1 mg, Oral, Q6H PRN, Meredith Pel, NP, 1 mg at 01/29/16 8119 .  magnesium sulfate IVPB 2 g 50 mL, 2 g, Intravenous, Once, Belkys A Regalado, MD .  multivitamin with minerals tablet 1 tablet, 1 tablet, Oral, Daily, Alberteen Sam, MD, 1 tablet at 01/29/16 [phone] .  pneumococcal 23 valent  vaccine (PNU-IMMUNE) injection 0.5 mL, 0.5 mL, Intramuscular, Tomorrow-1000, Alberteen Sam, MD .  polyvinyl alcohol (LIQUIFILM TEARS) 1.4 % ophthalmic solution 1 drop, 1 drop, Both Eyes, PRN, Belkys A Regalado, MD .  sodium chloride flush (NS) 0.9 % injection 3 mL, 3 mL, Intravenous, Q12H, Alberteen Sam, MD, 3 mL at 01/29/16 1000 .  thiamine (VITAMIN B-1) tablet 100 mg, 100 mg, Oral, Daily, 100 mg at 01/29/16 0839 **OR** [DISCONTINUED] thiamine (B-1) injection 100 mg, 100 mg, Intravenous, Daily, Alberteen Sam, MD .  traMADol (ULTRAM) tablet 50 mg, 50 mg, Oral, Q6H PRN, Alba Cory, MD, 50 mg at 01/29/16 0839 .  vitamin B-12 (CYANOCOBALAMIN) tablet 100 mcg, 100 mcg, Oral, Daily, Belkys A Regalado, MD, 100 mcg at 01/29/16 0838  Objective:  Temp:  [98.2 F (36.8 C)-98.5 F (36.9 C)] 98.5 F (36.9 C) (08/06 0600) Pulse Rate:  [122-128] 128 (08/06 0600) Resp:  [18-22] 18 (08/06 0600) BP: (124-139)/(91-109) 124/91 (08/06 0600) SpO2:  [98 %-100 %] 100 % (08/06 0600)  General: WDWN AA man in NAD. Alert, ortiented x4. Speech is clear without dysarthria. Affect is bright. Comportment is normal.  HEENT: Neck is supple without lymphadenopathy. Mucous membranes are moist and the oropharynx is clear. Sclerae are anicteric. There is no conjunctival injection.  CV: Regular, no murmur. Carotid pulses are 2+ and symmetric with no bruits. Distal pulses 2+ and symmetric.  Lungs: CTAB  Extremities: No C/C/E. Neuro: MS: As noted  above. No aphasia.  CN: Pupils are equal and reactive from 3-->2 mm bilaterally. Visual fields are full. EOMI, no nystagmus. Facial sensation is intact to light touch. Face is symmetric at rest with normal strength and mobility. Hearing is intact to conversational voice. Voice is normal in tone and quality. Palate elevates symmetrically. Uvula is midline. Bilateral SCM and trapezii are 5/5. Tongue is midline with normal bulk and mobility.  Motor: Normal bulk,  tone, and strength throughout. Testing of the lower legs, feet, and hands is limited by allodynia but these appear grossly normal. No pronator drift. No tremor or other abnormal movements are observed.  Sensation: He has a graded loss of all sensory modalities in a stocking-glove distribution extending to both elbows and to the distal thighs bilaterally.   DTRs: Absent.  Coordination: Finger-to-nose and heel-to-shin are slow but without dysmetria bilaterally.  Gait: Romberg positive. Casual gait is slow with a widened base.   Labs: Lab Results  Component Value Date   WBC 11.7 (H) 01/29/2016   HGB 9.7 (L) 01/29/2016   HCT 28.9 (L) 01/29/2016   PLT 174 01/29/2016   GLUCOSE 87 01/29/2016   ALT 41 01/27/2016   AST 122 (H) 01/27/2016   NA 135 01/29/2016   K 3.8 01/29/2016   CL 101 01/29/2016   CREATININE 0.51 (L) 01/29/2016   BUN <5 (L) 01/29/2016   CO2 26 01/29/2016   TSH 3.334 01/27/2016   HGBA1C 4.9 01/27/2016   CBC Latest Ref Rng & Units 01/29/2016 01/28/2016 01/27/2016  WBC 4.0 - 10.5 K/uL 11.7(H) 11.0(H) 11.2(H)  Hemoglobin 13.0 - 17.0 g/dL 0.9(W9.7(L) 10.1(L) 9.9(L)  Hematocrit 39.0 - 52.0 % 28.9(L) 29.1(L) 28.5(L)  Platelets 150 - 400 K/uL 174 165 142(L)    Lab Results  Component Value Date   HGBA1C 4.9 01/27/2016   Lab Results  Component Value Date   ALT 41 01/27/2016   AST 122 (H) 01/27/2016   ALKPHOS 181 (H) 01/27/2016   BILITOT 1.4 (H) 01/27/2016   CK 242   A/P:  1. 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 Differential is broad and includes nutritional deficiency (vitamin B12, vitamin B6), heavy metal toxicity, connective tissue disease (RA, SLE, Sjogrens), sarcoidosis, vasculitis, cryoglobulinemia, gammopathy, ethanol abuse. Appropriate labs have been sent and are pending. In the meantime, continue gabapentin and titrate as needed to max dose 1200 mg TID. I will add a low dose of amitriptyline qhs to see if  this will help him sleep. Continue B12 supplementation. Discussed the importance of total cessation from alcohol to prevent additional nerve injury moving forward.   2. Vision loss: Consistent with subacute optic neuropathy. Differential and workup as above.   Rhona Leavensimothy Aleyza Salmi, MD [facility]

## 2016-01-29 NOTE — Progress Notes (Signed)
PROGRESS NOTE    Albert Lara  ZOX:[id number] DOB: 04/13/1976 DOA: 01/26/2016 PCP: No primary care provider on file.    Brief Narrative: Albert Lara is a 40 y.o. male with a past medical history significant for depression and alcohol use who presents with multiple complaints for 2 months.  The patient has a hard time specifying the chronicity of his ailments, but somewhere over the last 2 months, he has had worsening bilateral leg numbness, progressing to shooting severe pains in both calves, numbness/paresthesias in both fingers/hands, loss of vision ("hazy" or "blurry" vision), and leg weakness to the point that he can't climb stairs this week.  He had been living with his mother in [place] until a few weeks ago, when he came to [place].  He has been drinking up to 1 quart of vodka daily for many months, "I barely eat" and had lost >50 lbs.  Does not use injection drugs.  No previous medical complaints, no daily medications, remote history of depression.  Tonight, a family member taking care of him here in [place] made him come to the ER    Assessment & Plan:   Principal Problem:   Acute hypokalemia Active Problems:   Hypomagnesemia   Abnormal levels of other serum enzymes   Vision changes   Hypocalcemia   Alcohol use disorder, severe, dependence (HCC)   Leg swelling   Peripheral neuropathy (HCC)   Hypokalemia   Malnutrition of moderate degree  1.  Neuropathy: unclear etiology  -Hgb-A1c 4.9, normal. - B12 level low normal, check MMA. Start supplements.  - HIV negative and  RPR negative,.  - Vitamin D pending. RF, ANA, ANCA, Angiotensin C enzyme, Sjogrens, MPO 3 , heavy metals, Protein electrophoresis, cryoglobulin.  B6  -TSH normal.  - ERS 22, CRP 2.4 .  -neurology consulted. Appreciate evaluation.  -Gabapentin 400 mg TID  1. Hypokalemia:  Suspect this is acute or subacute from alcohol use and poor intake.    Given 70 mEQ K in ER. Resolved.   2.  Hypocalcemia and hypomagnesemia:  received calcium and mg supplement.  calcium corrected by albumin 7.6. Oral supplement Replace Mg IV   3. Transaminitis:  Likely from alcohol. -acute hepatitis panel negative and [place] hepatomegalia.   4. Anion gap elevation without acidosis by BMP:  Infection is not present. -Check lactic acid and osmolal gap  5. Alcohol use disorder, severe, desires to quit:  -CIWA scoring -Folate, thiamine, and MVI ordered -Consult to Social Work  6. Malnutrition: The patient has multiple complaints that appear at first to be related to malnutrition and electrolyte disturbances.  His neuropathy sounds as if it stretches back longer than the last 2 months, and so perhaps this is related to diabetes, which runs in the family.     7. Vision loss:  Suspect this is from malnutrition. Unclear etiology  -vitamin A level, thiamine, folic acid, pending  opthalmology consulted. Dr Charlotte Sanes recommend to check vitamin level and patient to follow in the office on Monday.  Lipase normal.  conjunctivitis; started  cipro. Improved.   8. Leg swelling: Suspect again this is from malnutrition.   Doppler negative for DVT  Uric acid negative.   9. Anemia, normocytic, unclear cause: --iron 155, ferritin 1915 -folate pending. and B-12 low normal, check MMA.  -started  B 12 supplement.    10-tachycardia; monitor    DVT prophylaxis: lovenox.  Code Status: full code.  Family Communication: care discussed with patient.  Disposition Plan: to be determine  Consultants:   Opthalmology Dr Charlotte Sanes , recommend vitamin B 12, thiamine, PRP and patient to follow in her office next week.   Neurology     Procedures:   Doppler lower extremities negative for DVT   Antimicrobials:   none   Subjective: Still complaint of LE pain, and numbness finger tip.     Objective: Vitals:   01/28/16 1417 01/28/16 2109 01/29/16 0017 01/29/16 0600  BP: (!) 124/97 (!) 133/109  (!) 139/103 (!) 124/91  Pulse: (!) 123 (!) 126 (!) 122 (!) 128  Resp: 19 19 (!) 22 18  Temp: 98.5 F (36.9 C) 98.5 F (36.9 C) 98.2 F (36.8 C) 98.5 F (36.9 C)  TempSrc: Oral Oral Oral Oral  SpO2: 100% 98% 100% 100%  Weight:      Height:        Intake/Output Summary (Last 24 hours) at 01/29/16 1333 Last data filed at 01/29/16 1300  Gross per 24 hour  Intake          1821.67 ml  Output             1200 ml  Net           621.67 ml   Filed Weights   01/26/16 2014 01/27/16 0400  Weight: 66.2 kg (146 lb) 67.3 kg (148 lb 5.9 oz)    Examination:  General exam: Appears calm and comfortable  Respiratory system: Clear to auscultation. Respiratory effort normal. Cardiovascular system: S1 & S2 heard, RRR. No JVD, murmurs, rubs, gallops or clicks. No pedal edema. Gastrointestinal system: Abdomen is nondistended, soft and nontender. No organomegaly or masses felt. Normal bowel sounds heard. Central nervous system: Alert and oriented. No focal neurological deficits. Extremities: Symmetric 5 x 5 power. Skin: No rashes, lesions or ulcers Psychiatry: Judgement and insight appear normal. Mood & affect appropriate.     Data Reviewed: I have personally reviewed following labs and imaging studies  CBC:  Recent Labs Lab 01/26/16 2055 01/27/16 0603 01/28/16 0841 01/29/16 0652  WBC 10.3 11.2* 11.0* 11.7*  NEUTROABS 6.2  --   --   --   HGB 11.2* 9.9* 10.1* 9.7*  HCT 30.2* 28.5* 29.1* 28.9*  MCV 107.9* 112.6* 116.4* 115.1*  PLT 208 142* 165 174   Basic Metabolic Panel:  Recent Labs Lab 01/26/16 2055 01/26/16 2114 01/27/16 0603 01/27/16 1654 01/28/16 0841 01/29/16 0652  NA 134*  --  136 136 135 135  K 1.9*  --  3.0* 3.9 4.0 3.8  CL 84*  --  92* 100* 102 101  CO2 31  --  30 28 24 26   GLUCOSE 134*  --  99 91 83 87  BUN 5*  --  <5* <5* <5* <5*  CREATININE 0.55*  --  0.66 0.49* 0.49* 0.51*  CALCIUM 7.0*  --  6.6* 6.6* 7.0* 7.8*  MG  --  1.0*  --   --   --  1.3*  PHOS  --   2.7  --   --   --   --    GFR: Estimated Creatinine Clearance: 118 mL/min (by C-G formula based on SCr of 0.8 mg/dL). Liver Function Tests:  Recent Labs Lab 01/26/16 2055 01/27/16 0603  AST 137* 122*  ALT 47 41  ALKPHOS 182* 181*  BILITOT 1.4* 1.4*  PROT 6.8 5.8*  ALBUMIN 3.3* 2.8*    Recent Labs Lab 01/27/16 1654  LIPASE 43  AMYLASE 98   No results for input(s): AMMONIA in the last 168 hours.  Coagulation Profile: No results for input(s): INR, PROTIME in the last 168 hours. Cardiac Enzymes:  Recent Labs Lab 01/28/16 2134  CKTOTAL 242   BNP (last 3 results) No results for input(s): PROBNP in the last 8760 hours. HbA1C:  Recent Labs  01/27/16 0248  HGBA1C 4.9   CBG:  Recent Labs Lab 01/26/16 2106 01/27/16 2013  GLUCAP 160* 132*   Lipid Profile: No results for input(s): CHOL, HDL, LDLCALC, TRIG, CHOLHDL, LDLDIRECT in the last 72 hours. Thyroid Function Tests:  Recent Labs  01/26/16 2114 01/27/16 0248  TSH 3.582 3.334  FREET4 1.06  --    Anemia Panel:  Recent Labs  01/27/16 0235 01/27/16 0603  VITAMINB12 302  --   FERRITIN 1,915*  --   TIBC 171*  --   IRON 155  --   RETICCTPCT  --  2.0   Sepsis Labs:  Recent Labs Lab 01/27/16 0235 01/27/16 0606 01/27/16 1135  LATICACIDVEN 2.6* 2.6* 2.3*    No results found for this or any previous visit (from the past 240 hour(s)).       Radiology Studies: [place] Abdomen Limited Ruq  Result Date: 01/28/2016 CLINICAL DATA:  40 year old male EXAM: US ABDOMEN LIMITED - RIGHT UPPER QUADRANT COMPARISON:  None. FINDINGS: Gallbladder: Lumen of the gallbladder is approximately 50% occupied with reflective microlithiasis/ sludge at the dependent aspects. Mild wall thickening with trace pericholecystic fluid. Negative sonographic Murphy's sign. Common bile duct: Diameter: 3 mm-4 mm Liver: Enlarged cranial caudal span of the liver with relatively heterogeneous echotexture. IMPRESSION: Cholelithiasis with  approximately 50% of the lumen of the gallbladder replaced with microlithiasis/sludge. No sonographic evidence of acute cholecystitis. If there is ongoing concern for acute cholecystitis, correlation with lab values, presentation, and HIDA study is recommended. Hepatomegaly Signed, Yvone NeuJaime S. Loreta AveWagner, DO [facility] Electronically Signed   By: Gilmer MorJaime  Wagner D.O.   On: 01/28/2016 10:22        Scheduled Meds: . amitriptyline  25 mg Oral QHS  . calcium carbonate  1 tablet Oral BID WC  . carvedilol  3.125 mg Oral BID WC  . ciprofloxacin  1 drop Both Eyes Q4H while awake  . enoxaparin (LOVENOX) injection  40 mg Subcutaneous Q24H  . famotidine  20 mg Oral BID  . feeding supplement  1 Container Oral TID BM  . folic acid  1 mg Oral Daily  . gabapentin  400 mg Oral TID  . multivitamin with minerals  1 tablet Oral Daily  . pneumococcal 23 valent vaccine  0.5 mL Intramuscular Tomorrow-1000  . sodium chloride flush  3 mL Intravenous Q12H  . thiamine  100 mg Oral Daily  . vitamin B-12  100 mcg Oral Daily   Continuous Infusions:     LOS: 2 days    Time spent: 35 minutes.     Alba Coryegalado, Belkys A, MD [facility] Pager [phone]  If 7PM-7AM, please contact night-coverage [email or web address] Password [email or web address] 01/29/2016, 1:33 PM

## 2016-01-30 DIAGNOSIS — G629 Polyneuropathy, unspecified: Secondary | ICD-10-CM

## 2016-01-30 LAB — FOLATE RBC
FOLATE, HEMOLYSATE: 180.4 ng/mL
FOLATE, RBC: 651 ng/mL (ref >498)
HEMATOCRIT: 27.7 % — AB (ref 37.5–51.0)

## 2016-01-30 LAB — ANCA TITERS
Atypical P-ANCA titer: <1:20 {titer}
P-ANCA: <1:20 {titer}

## 2016-01-30 LAB — PROTEIN ELECTROPHORESIS, SERUM
A/G RATIO SPE: 1.1 (ref 0.7–1.7)
ALBUMIN ELP: 2.9 g/dL (ref 2.9–4.4)
ALPHA-2-GLOBULIN: 0.7 g/dL (ref 0.4–1.0)
Alpha-1-Globulin: 0.3 g/dL (ref 0.0–0.4)
BETA GLOBULIN: 0.8 g/dL (ref 0.7–1.3)
GLOBULIN, TOTAL: 2.7 g/dL (ref 2.2–3.9)
Gamma Globulin: 0.9 g/dL (ref 0.4–1.8)
Total Protein ELP: 5.6 g/dL — ABNORMAL LOW (ref 6.0–8.5)

## 2016-01-30 LAB — MPO/PR-3 (ANCA) ANTIBODIES: ANCA Proteinase 3: <3.5 U/mL (ref 0.0–3.5)

## 2016-01-30 LAB — ANGIOTENSIN CONVERTING ENZYME: ANGIOTENSIN-CONVERTING ENZYME: 77 U/L (ref 14–82)

## 2016-01-30 LAB — RHEUMATOID FACTOR

## 2016-01-30 LAB — ANTINUCLEAR ANTIBODIES, IFA: ANTINUCLEAR ANTIBODIES, IFA: NEGATIVE

## 2016-01-30 LAB — VITAMIN D 25 HYDROXY (VIT D DEFICIENCY, FRACTURES): VIT D 25 HYDROXY: 5.4 ng/mL — AB (ref 30.0–100.0)

## 2016-01-30 LAB — SJOGRENS SYNDROME-B EXTRACTABLE NUCLEAR ANTIBODY

## 2016-01-30 LAB — SJOGRENS SYNDROME-A EXTRACTABLE NUCLEAR ANTIBODY: SSA (RO) (ENA) ANTIBODY, IGG: 0.2 AI (ref 0.0–0.9)

## 2016-01-30 MED ORDER — METOPROLOL TARTRATE 25 MG PO TABS
25.0000 mg | ORAL_TABLET | Freq: Two times a day (BID) | ORAL | Status: DC
  Administered 2016-01-30 – 2016-02-02 (×7): 25 mg via ORAL
  Filled 2016-01-30 (×7): qty 1

## 2016-01-30 MED ORDER — MAGNESIUM SULFATE 2 GM/50ML IV SOLN
2.0000 g | Freq: Once | INTRAVENOUS | Status: AC
  Administered 2016-01-30: 2 g via INTRAVENOUS
  Filled 2016-01-30: qty 50

## 2016-01-30 MED ORDER — VITAMIN D (ERGOCALCIFEROL) 1.25 MG (50000 UNIT) PO CAPS
50000.0000 [IU] | ORAL_CAPSULE | ORAL | Status: DC
  Administered 2016-01-30: 50000 [IU] via ORAL
  Filled 2016-01-30 (×2): qty 1

## 2016-01-30 MED FILL — Hydromorphone HCl Inj 2 MG/ML: INTRAMUSCULAR | Qty: 1 | Status: AC

## 2016-01-30 NOTE — Progress Notes (Signed)
PROGRESS NOTE    Albert Lara  ZOX:[id number] DOB: Jan 05, 1976 DOA: 01/26/2016 PCP: No primary care provider on file.    Brief Narrative: Albert Lara is a 40 y.o. male with a past medical history significant for depression and alcohol use who presents with multiple complaints for 2 months.  The patient has a hard time specifying the chronicity of his ailments, but somewhere over the last 2 months, he has had worsening bilateral leg numbness, progressing to shooting severe pains in both calves, numbness/paresthesias in both fingers/hands, loss of vision ("hazy" or "blurry" vision), and leg weakness to the point that he can't climb stairs this week.  He had been living with his mother in [place] until a few weeks ago, when he came to [place].  He has been drinking up to 1 quart of vodka daily for many months, "I barely eat" and had lost >50 lbs.  Does not use injection drugs.  No previous medical complaints, no daily medications, remote history of depression.  Tonight, a family member taking care of him here in [place] made him come to the ER    Assessment & Plan:   Principal Problem:   Acute hypokalemia Active Problems:   Hypomagnesemia   Abnormal levels of other serum enzymes   Vision changes   Hypocalcemia   Alcohol use disorder, severe, dependence (HCC)   Leg swelling   Peripheral neuropathy (HCC)   Hypokalemia   Malnutrition of moderate degree  1.  Neuropathy: unclear etiology  -Hgb-A1c 4.9, normal. - B12 level low normal, check MMA. Start supplements.  - HIV negative and  RPR negative,.  - Vitamin D 5.4 . RF, ANA, ANCA, Angiotensin C enzyme, Sjogrens negative , MPO 3 , heavy metals, Protein electrophoresis, cryoglobulin.  B6  -TSH normal.  - ERS 22, CRP 2.4 .  -neurology consulted. Appreciate evaluation.  -Gabapentin 400 mg TID -star Vitamin D supplement.   1. Hypokalemia:  Suspect this is acute or subacute from alcohol use and poor intake.    Given 70  mEQ K in ER. Resolved.   2. Hypocalcemia and hypomagnesemia:  received calcium and mg supplement.  calcium corrected by albumin 7.6. Oral supplement Replace Mg IV   3. Transaminitis:  Likely from alcohol. -acute hepatitis panel negative and [place] hepatomegalia.   4. Anion gap elevation without acidosis by BMP:  Infection is not present. -Check lactic acid and osmolal gap  5. Alcohol use disorder, severe, desires to quit:  -CIWA scoring -Folate, thiamine, and MVI ordered -Consult to Social Work  6. Malnutrition: The patient has multiple complaints that appear at first to be related to malnutrition and electrolyte disturbances.  His neuropathy sounds as if it stretches back longer than the last 2 months, and so perhaps this is related to diabetes, which runs in the family.     7. Vision loss:  Suspect this is from malnutrition. Unclear etiology  -vitamin A level, thiamine, folic acid, pending  opthalmology consulted. Dr Charlotte Sanes recommend to check vitamin level and patient to follow in the office on Monday.  Lipase normal.  conjunctivitis; started  cipro. Improved.   8. Leg swelling: Suspect again this is from malnutrition.   Doppler negative for DVT  Uric acid negative.   9. Anemia, normocytic, unclear cause: --iron 155, ferritin 1915 -folate pending. and B-12 low normal, check MMA.  -started  B 12 supplement.    10-HTN; start metoprolol./    DVT prophylaxis: lovenox.  Code Status: full code.  Family Communication: care discussed  with patient.  Disposition Plan: to be determine    Consultants:   Opthalmology Dr Charlotte Sanes , recommend vitamin B 12, thiamine, PRP and patient to follow in her office next week.   Neurology     Procedures:   Doppler lower extremities negative for DVT   Antimicrobials:   none   Subjective: He is feeling better. LE pain improved,     Objective: Vitals:   01/29/16 1657 01/30/16 0022 01/30/16 0523 01/30/16 1253  BP:  (!) 137/96 (!) 134/104 (!) 140/105 117/88  Pulse: (!) 121 (!) 121 (!) 124 (!) 112  Resp:  Temp: 98.4 F (36.9 C) 97.9 F (36.6 C) 98 F (36.7 C) 97.9 F (36.6 C)  TempSrc: Oral Oral Oral Oral  SpO2: 99% 100% 91% 97%  Weight:      Height:        Intake/Output Summary (Last 24 hours) at 01/30/16 1344 Last data filed at 01/30/16 1255  Gross per 24 hour  Intake              600 ml  Output              400 ml  Net              200 ml   Filed Weights   01/26/16 2014 01/27/16 0400  Weight: 66.2 kg (146 lb) 67.3 kg (148 lb 5.9 oz)    Examination:  General exam: Appears calm and comfortable  Respiratory system: Clear to auscultation. Respiratory effort normal. Cardiovascular system: S1 & S2 heard, RRR. No JVD, murmurs, rubs, gallops or clicks. No pedal edema. Gastrointestinal system: Abdomen is nondistended, soft and nontender. No organomegaly or masses felt. Normal bowel sounds heard. Central nervous system: Alert and oriented. No focal neurological deficits. Extremities: Symmetric 5 x 5 power. Skin: No rashes, lesions or ulcers Psychiatry: Judgement and insight appear normal. Mood & affect appropriate.     Data Reviewed: I have personally reviewed following labs and imaging studies  CBC:  Recent Labs Lab 01/26/16 2055 01/27/16 0603 01/28/16 0841 01/29/16 0652  WBC 10.3 11.2* 11.0* 11.7*  NEUTROABS 6.2  --   --   --   HGB 11.2* 9.9* 10.1* 9.7*  HCT 30.2* 28.5* 29.1* 28.9*  MCV 107.9* 112.6* 116.4* 115.1*  PLT 208 142* 165 174   Basic Metabolic Panel:  Recent Labs Lab 01/26/16 2055 01/26/16 2114 01/27/16 0603 01/27/16 1654 01/28/16 0841 01/29/16 0652  NA 134*  --  136 136 135 135  K 1.9*  --  3.0* 3.9 4.0 3.8  CL 84*  --  92* 100* 102 101  CO2 31  --  GLUCOSE 134*  --  99 91 83 87  BUN 5*  --  <5* <5* <5* <5*  CREATININE 0.55*  --  0.66 0.49* 0.49* 0.51*  CALCIUM 7.0*  --  6.6* 6.6* 7.0* 7.8*  MG  --  1.0*  --   --   --  1.3*    PHOS  --  2.7  --   --   --   --    GFR: Estimated Creatinine Clearance: 118 mL/min (by C-G formula based on SCr of 0.8 mg/dL). Liver Function Tests:  Recent Labs Lab 01/26/16 2055 01/27/16 0603  AST 137* 122*  ALT 47 41  ALKPHOS 182* 181*  BILITOT 1.4* 1.4*  PROT 6.8 5.8*  ALBUMIN 3.3* 2.8*    Recent Labs Lab 01/27/16 1654  LIPASE  43  AMYLASE 98   No results for input(s): AMMONIA in the last 168 hours. Coagulation Profile: No results for input(s): INR, PROTIME in the last 168 hours. Cardiac Enzymes:  Recent Labs Lab 01/28/16 2134  CKTOTAL 242   BNP (last 3 results) No results for input(s): PROBNP in the last 8760 hours. HbA1C: No results for input(s): HGBA1C in the last 72 hours. CBG:  Recent Labs Lab 01/26/16 2106 01/27/16 2013  GLUCAP 160* 132*   Lipid Profile: No results for input(s): CHOL, HDL, LDLCALC, TRIG, CHOLHDL, LDLDIRECT in the last 72 hours. Thyroid Function Tests: No results for input(s): TSH, T4TOTAL, FREET4, T3FREE, THYROIDAB in the last 72 hours. Anemia Panel: No results for input(s): VITAMINB12, FOLATE, FERRITIN, TIBC, IRON, RETICCTPCT in the last 72 hours. Sepsis Labs:  Recent Labs Lab 01/27/16 0235 01/27/16 0606 01/27/16 1135  LATICACIDVEN 2.6* 2.6* 2.3*    No results found for this or any previous visit (from the past 240 hour(s)).       Radiology Studies: No results found.      Scheduled Meds: . amitriptyline  25 mg Oral QHS  . calcium carbonate  1 tablet Oral BID WC  . ciprofloxacin  1 drop Both Eyes Q4H while awake  . enoxaparin (LOVENOX) injection  40 mg Subcutaneous Q24H  . famotidine  20 mg Oral BID  . feeding supplement  1 Container Oral TID BM  . folic acid  1 mg Oral Daily  . gabapentin  400 mg Oral TID  . metoprolol tartrate  25 mg Oral BID  . multivitamin with minerals  1 tablet Oral Daily  . sodium chloride flush  3 mL Intravenous Q12H  . thiamine  100 mg Oral Daily  . vitamin B-12  1,000 mcg  Oral Daily   Continuous Infusions:     LOS: 3 days    Time spent: 35 minutes.     Alba Coryegalado, Almetta Liddicoat A, MD [facility] Pager [phone]  If 7PM-7AM, please contact night-coverage [email or web address] Password [email or web address] 01/30/2016, 1:44 PM

## 2016-01-30 NOTE — Progress Notes (Signed)
Patient's BP 129/102. On-call NP Schorr notified.  Will continue to monitor Patient and await NP's orders.

## 2016-01-30 NOTE — Progress Notes (Signed)
Subjective:  Albert DeerChristopher is resting comfortably in bed. He notes some improvement in his dysesthesias. He is noted to have weakness in the hands and feet. He has some swelling of the lower extremities. Appropriate laboratory studies have been requested and are pending at this time.  Exam: Vitals:   01/30/16 0022 01/30/16 0523  BP: (!) 134/104 (!) 140/105  Pulse: (!) 121 (!) 124  Resp: 18 18  Temp: 97.9 F (36.6 C) 98 F (36.7 C)    HEENT-  Normocephalic, no lesions, without obvious abnormality.  Normal external eye and conjunctiva.  Normal TM's bilaterally.  Normal auditory canals and external ears. Normal external nose, mucus membranes and septum.  Normal pharynx. Cardiovascular- regular rate and rhythm, S1, S2 normal, no murmur, click, rub or gallop, pulses palpable throughout   Lungs- chest clear, no wheezing, rales, normal symmetric air entry, Heart exam - S1, S2 normal, no murmur, no gallop, rate regular Abdomen- soft, non-tender; bowel sounds normal; no masses,  no organomegaly Extremities- mild LE edema Lymph-no adenopathy palpable     Gen: In bed, NAD MS: Alert and oriented CN: Cranial nerves II through XII are intact. Motor: Finger abduction is 4 out of 5 foot dorsiflexion appears 4 out of 5 but the exam is limited due to pain. The patient is able to walk. Sensory: Sensation is decreased pinprick and light touch distally in the lower extremities the mid calves and in the upper extremities to the mid forearms. Vibratory sensation is reduced at the toes. DTR: 1+ to trace.  Pertinent Labs/Diagnostics: Reviewed    Impression:   [name]'s pleasant 40 year old gentleman who presents with a subacute sensory and motor peripheral neuropathy that is dysesthetic in nature. He is also noted to have slight swelling of the lower extremities. His inflammatory markers are mildly elevated. Additional testing for numerous forms of neuropathy are in  progress.   Recommendations:  1. The differential diagnosis for this presentation is fairly broad. Numerous laboratory studies have been requested and results are pending at this time.   James A. Hilda BladesArmstrong, M.D. [facility] Phone: [phone]   01/30/2016, 9:59 AM

## 2016-01-30 NOTE — Progress Notes (Signed)
PT Cancellation Note  Patient Details Name: Albert Lara MRN: [id number] DOB: [birth year]   Cancelled Treatment:    Reason Eval/Treat Not Completed: Medical issues which prohibited therapy.  Diastolic BP very high, awaiting reduction and will see later as time and pt allow.   Ivar DrapeStout, Albert Lara 01/30/2016, 10:39 AM    Samul Dadauth [name], PT MS Acute Rehab Dept. Number: [facility] [id number] and [facility] [phone]

## 2016-01-31 DIAGNOSIS — E512 Wernicke's encephalopathy: Secondary | ICD-10-CM

## 2016-01-31 LAB — VITAMIN A: Vitamin A (Retinoic Acid): 23 ug/dL — ABNORMAL LOW (ref 24–85)

## 2016-01-31 LAB — VITAMIN B1: Vitamin B1 (Thiamine): 87.5 nmol/L (ref 66.5–200.0)

## 2016-01-31 LAB — HEAVY METALS PROFILE, URINE
Arsenic (Total),U: NOT DETECTED ug/L (ref 0–50)
Arsenic(Inorganic),U: NOT DETECTED ug/L (ref 0–19)
CREATININE(CRT), U: 0.27 g/L — AB (ref 0.30–3.00)
LEAD RANDOM URINE: NOT DETECTED ug/L (ref 0–49)
MERCURY UR: NOT DETECTED ug/L (ref 0–19)

## 2016-01-31 LAB — AMMONIA: Ammonia: 64 umol/L — ABNORMAL HIGH (ref 9–35)

## 2016-01-31 LAB — BASIC METABOLIC PANEL
ANION GAP: 7 (ref 5–15)
BUN: 5 mg/dL — AB (ref 6–20)
CALCIUM: 8.8 mg/dL — AB (ref 8.9–10.3)
CO2: 25 mmol/L (ref 22–32)
Chloride: 102 mmol/L (ref 101–111)
Creatinine, Ser: 0.56 mg/dL — ABNORMAL LOW (ref 0.61–1.24)
GFR calc Af Amer: >60 mL/min (ref >60)
GLUCOSE: 111 mg/dL — AB (ref 65–99)
Potassium: 5 mmol/L (ref 3.5–5.1)
Sodium: 134 mmol/L — ABNORMAL LOW (ref 135–145)

## 2016-01-31 LAB — CBC
HEMATOCRIT: 31.4 % — AB (ref 39.0–52.0)
Hemoglobin: 10.5 g/dL — ABNORMAL LOW (ref 13.0–17.0)
MCH: 38.9 pg — AB (ref 26.0–34.0)
MCHC: 33.4 g/dL (ref 30.0–36.0)
MCV: 116.3 fL — AB (ref 78.0–100.0)
PLATELETS: 225 10*3/uL (ref 150–400)
RBC: 2.7 MIL/uL — ABNORMAL LOW (ref 4.22–5.81)
RDW: 16.2 % — AB (ref 11.5–15.5)
WBC: 11.3 10*3/uL — ABNORMAL HIGH (ref 4.0–10.5)

## 2016-01-31 LAB — MAGNESIUM: Magnesium: 1.4 mg/dL — ABNORMAL LOW (ref 1.7–2.4)

## 2016-01-31 MED ORDER — THIAMINE HCL 100 MG PO TABS: 100.0000 mg | ORAL_TABLET | Freq: Every day | ORAL | 0 refills | Status: AC

## 2016-01-31 MED ORDER — HYDRALAZINE HCL 20 MG/ML IJ SOLN
5.0000 mg | Freq: Once | INTRAMUSCULAR | Status: DC
  Filled 2016-01-31: qty 1

## 2016-01-31 MED ORDER — MAGNESIUM SULFATE 2 GM/50ML IV SOLN
2.0000 g | Freq: Once | INTRAVENOUS | Status: AC
  Administered 2016-01-31: 2 g via INTRAVENOUS
  Filled 2016-01-31: qty 50

## 2016-01-31 MED ORDER — HYDRALAZINE HCL 20 MG/ML IJ SOLN
10.0000 mg | Freq: Four times a day (QID) | INTRAMUSCULAR | Status: DC | PRN
  Administered 2016-01-31: 10 mg via INTRAVENOUS
  Filled 2016-01-31: qty 1

## 2016-01-31 MED ORDER — LORAZEPAM 2 MG/ML IJ SOLN
2.0000 mg | INTRAMUSCULAR | Status: DC | PRN
  Administered 2016-01-31 – 2016-02-01 (×4): 2 mg via INTRAVENOUS
  Filled 2016-01-31 (×4): qty 1

## 2016-01-31 MED ORDER — METOPROLOL TARTRATE 25 MG PO TABS: 25.0000 mg | ORAL_TABLET | Freq: Two times a day (BID) | ORAL | 0 refills | Status: AC

## 2016-01-31 MED ORDER — FOLIC ACID 1 MG PO TABS: 1.0000 mg | ORAL_TABLET | Freq: Every day | ORAL | 0 refills | Status: AC

## 2016-01-31 MED ORDER — ADULT MULTIVITAMIN W/MINERALS CH: 1.0000 | ORAL_TABLET | Freq: Every day | ORAL | 0 refills | Status: AC

## 2016-01-31 MED ORDER — MAGNESIUM OXIDE 400 (241.3 MG) MG PO TABS
400.0000 mg | ORAL_TABLET | Freq: Two times a day (BID) | ORAL | Status: DC
  Administered 2016-01-31 – 2016-02-02 (×5): 400 mg via ORAL
  Filled 2016-01-31 (×5): qty 1

## 2016-01-31 MED ORDER — CIPROFLOXACIN HCL 0.3 % OP SOLN: 1.0000 [drp] | OPHTHALMIC | 0 refills | Status: DC

## 2016-01-31 MED ORDER — LORAZEPAM 2 MG/ML IJ SOLN
1.0000 mg | Freq: Once | INTRAMUSCULAR | Status: AC
  Administered 2016-01-31: 1 mg via INTRAVENOUS

## 2016-01-31 MED ORDER — SODIUM CHLORIDE 0.9 % IV SOLN
500.0000 mg | Freq: Three times a day (TID) | INTRAVENOUS | Status: DC
  Administered 2016-01-31 – 2016-02-01 (×3): 500 mg via INTRAVENOUS
  Filled 2016-01-31 (×6): qty 5

## 2016-01-31 MED ORDER — VITAMIN D (ERGOCALCIFEROL) 1.25 MG (50000 UNIT) PO CAPS: 50000.0000 [IU] | ORAL_CAPSULE | ORAL | 0 refills | Status: AC

## 2016-01-31 MED ORDER — CALCIUM CARBONATE 1250 (500 CA) MG PO TABS: 1.0000 | ORAL_TABLET | Freq: Two times a day (BID) | ORAL | 0 refills | Status: AC

## 2016-01-31 MED ORDER — GABAPENTIN 400 MG PO CAPS: 400.0000 mg | ORAL_CAPSULE | Freq: Three times a day (TID) | ORAL | 0 refills | Status: AC

## 2016-01-31 MED ORDER — LORAZEPAM 2 MG/ML IJ SOLN
1.0000 mg | Freq: Four times a day (QID) | INTRAMUSCULAR | Status: DC | PRN
Start: 2016-01-31 — End: 2016-01-31
  Filled 2016-01-31: qty 1

## 2016-01-31 MED ORDER — LORAZEPAM 2 MG/ML IJ SOLN
INTRAMUSCULAR | Status: AC
  Administered 2016-01-31: 2 mg
  Filled 2016-01-31: qty 1

## 2016-01-31 MED ORDER — AMITRIPTYLINE HCL 25 MG PO TABS: 25.0000 mg | ORAL_TABLET | Freq: Every day | ORAL | 0 refills | Status: AC

## 2016-01-31 MED ORDER — LORAZEPAM 2 MG/ML IJ SOLN
1.0000 mg | INTRAMUSCULAR | Status: DC | PRN
Start: 2016-01-31 — End: 2016-01-31
  Filled 2016-01-31 (×2): qty 1

## 2016-01-31 MED ORDER — VITAMIN A 50000 UNIT/ML IM SOLN: 100000.0000 [IU] | Freq: Every day | INTRAMUSCULAR | 0 refills | Status: AC

## 2016-01-31 MED ORDER — VITAMIN B-1 100 MG PO TABS: 100.0000 mg | ORAL_TABLET | Freq: Every day | ORAL | Status: DC

## 2016-01-31 MED ORDER — VITAMIN A 50000 UNIT/ML IM SOLN
100000.0000 [IU] | Freq: Every day | INTRAMUSCULAR | Status: DC
  Administered 2016-01-31 – 2016-02-01 (×2): 100000 [IU] via INTRAMUSCULAR
  Filled 2016-01-31 (×3): qty 2

## 2016-01-31 MED ORDER — MAGNESIUM OXIDE 400 (241.3 MG) MG PO TABS: 400.0000 mg | ORAL_TABLET | Freq: Two times a day (BID) | ORAL | 0 refills | Status: AC

## 2016-01-31 MED ORDER — BOOST / RESOURCE BREEZE PO LIQD: 1.0000 | Freq: Three times a day (TID) | ORAL | 0 refills | Status: AC

## 2016-01-31 MED ORDER — LACTULOSE 10 GM/15ML PO SOLN
30.0000 g | Freq: Three times a day (TID) | ORAL | Status: DC
  Administered 2016-01-31 – 2016-02-02 (×6): 30 g via ORAL
  Filled 2016-01-31 (×6): qty 45

## 2016-01-31 MED ORDER — TRAMADOL HCL 50 MG PO TABS: 50.0000 mg | ORAL_TABLET | Freq: Four times a day (QID) | ORAL | 0 refills | Status: AC | PRN

## 2016-01-31 MED ORDER — CYANOCOBALAMIN 1000 MCG PO TABS: 1000.0000 ug | ORAL_TABLET | Freq: Every day | ORAL | 0 refills | Status: AC

## 2016-01-31 NOTE — Progress Notes (Signed)
NEURO HOSPITALIST CONSULT NOTE   Requestig physician: Dr. Sunnie Nielsenegalado   Reason for Consult: Hallucinations in the setting of ETOH withdrawal and likely Wernickies   HPI:                                                                                                                                          Albert Lara is an 40 y.o. male male with long history of ETOH abuse. He is currently under the effects of Ativan and very drowsy.  He is able to state he has been drinking a Pint of Vodka daily for many years. HE was brought to the hospital on 8/3 for "worsening bilateral leg numbness, progressing to shooting severe pains in both calves, numbness/paresthesias in both fingers/hands, loss of vision ("hazy" or "blurry" vision), and leg weakness to the point that he can't climb stairs this week.  He had [place] living with [place] mother in [place] until a few weeks ago, when he came to [place].  He has been drinking up to 1 quart of vodka daily for many months, "I barely eat" and had lost >50 lbs. "  He was admitted and placed only on 100 mg Thiamine a day and CIWA scale. His Thiamine was 87 on admission. He is now 5 days post admission and nurse has noted visual hallucinations today. At this time no brain imaging has been obtained. Due to recently getting 2 mg Ativan it is hard to obtain a clear history and exam.  History reviewed. No pertinent past medical history.  Past Surgical History:  Procedure Laterality Date  . TONSILLECTOMY      Family History  Problem Relation Age of Onset  . Diabetes Mother      Social History:  reports that he has been smoking Cigarettes.  He has been smoking about 1.00 pack per day. He has never used smokeless tobacco. He reports that he drinks alcohol. His drug history is not on file.  No Known Allergies  MEDICATIONS:                                                                                                                      Prior to Admission:  No prescriptions prior to admission.   Scheduled: . amitriptyline  25 mg Oral  QHS  . calcium carbonate  1 tablet Oral BID WC  . ciprofloxacin  1 drop Both Eyes Q4H while awake  . enoxaparin (LOVENOX) injection  40 mg Subcutaneous Q24H  . famotidine  20 mg Oral BID  . feeding supplement  1 Container Oral TID BM  . folic acid  1 mg Oral Daily  . gabapentin  400 mg Oral TID  . hydrALAZINE  5 mg Intravenous Once  . lactulose  30 g Oral TID  . magnesium oxide  400 mg Oral BID  . metoprolol tartrate  25 mg Oral BID  . multivitamin with minerals  1 tablet Oral Daily  . sodium chloride flush  3 mL Intravenous Q12H  . [START ON 02/04/2016] thiamine  100 mg Oral Daily  . thiamine injection  500 mg Intravenous TID  . vitamin A  100,000 Units Intramuscular Daily  . vitamin B-12  1,000 mcg Oral Daily  . Vitamin D (Ergocalciferol)  50,000 Units Oral Q7 days     ROS:                                                                                                                                       History obtained from unobtainable from patient due to mental status    Blood pressure 104/78, pulse (!) 108, temperature 98.2 F (36.8 C), temperature source Oral, resp. rate 18, height  (1.727 m), weight 67.3 kg (148 lb 5.9 oz), SpO2 99 %.   Neurologic Examination:                                                                                                      HEENT-  Normocephalic, no lesions, without obvious abnormality.  Normal external eye and conjunctiva.  Normal TM's bilaterally.  Normal auditory canals and external ears. Normal external nose, mucus membranes and septum.  Normal pharynx. Cardiovascular- S1, S2 normal, pulses palpable throughout   Lungs- chest clear, no wheezing, rales, normal symmetric air entry Abdomen- normal findings: bowel sounds normal Extremities- no edema Lymph-no adenopathy palpable Musculoskeletal-no joint tenderness, deformity or  swelling Skin-warm and dry, no hyperpigmentation, vitiligo, or suspicious lesions  Neurological Examination Mental Status: Alert, not oriented difficult to follow his thoughts.   Speech very dysarthric without evidence of aphasia.  Able to follow 1 step commands with confusion due to  His drowsiness. Cranial Nerves: II:  Visual fields grossly normal, pupils equal, round, reactive to light and accommodation III,IV, VI: ptosis not present, extra-ocular motions  intact bilaterally--hard to examine for nystagmus due to incorporation.  V,VII: smile symmetric, facial light touch sensation normal bilaterally VIII: hearing normal bilaterally IX,X: uvula rises symmetrically XI: bilateral shoulder shrug XII: midline tongue extension Motor: Right : Upper extremity   5/5    Left:     Upper extremity   5/5  Lower extremity   5/5     Lower extremity   5/5 Tone and bulk:normal tone throughout; no atrophy noted Sensory: Pinprick and light touch intact throughout, bilaterally Deep Tendon Reflexes: 2+ and symmetric throughout UE and no KJ or AJ Plantars: Right: downgoing   Left: downgoing Cerebellar: normal finger-to-nose, Gait: not tested for safety      Lab Results: Basic Metabolic Panel:  Recent Labs Lab 01/26/16 2055 01/26/16 2114 01/27/16 0603 01/27/16 1654 01/28/16 0841 01/29/16 0652 01/31/16 0741  NA 134*  --  136 136 135 135  --   K 1.9*  --  3.0* 3.9 4.0 3.8  --   CL 84*  --  92* 100* 102 101  --   CO2 31  --  30 28 24 26   --   GLUCOSE 134*  --  99 91 83 87  --   BUN 5*  --  <5* <5* <5* <5*  --   CREATININE 0.55*  --  0.66 0.49* 0.49* 0.51*  --   CALCIUM 7.0*  --  6.6* 6.6* 7.0* 7.8*  --   MG  --  1.0*  --   --   --  1.3* 1.4*  PHOS  --  2.7  --   --   --   --   --     Liver Function Tests:  Recent Labs Lab 01/26/16 2055 01/27/16 0603  AST 137* 122*  ALT 47 41  ALKPHOS 182* 181*  BILITOT 1.4* 1.4*  PROT 6.8 5.8*  ALBUMIN 3.3* 2.8*    Recent Labs Lab  01/27/16 1654  LIPASE 43  AMYLASE 98    Recent Labs Lab 01/31/16 1249  AMMONIA 64*    CBC:  Recent Labs Lab 01/26/16 2055 01/27/16 0240 01/27/16 0603 01/28/16 0841 01/29/16 0652 01/31/16 1433  WBC 10.3  --  11.2* 11.0* 11.7* 11.3*  NEUTROABS 6.2  --   --   --   --   --   HGB 11.2*  --  9.9* 10.1* 9.7* 10.5*  HCT 30.2* 27.7* 28.5* 29.1* 28.9* 31.4*  MCV 107.9*  --  112.6* 116.4* 115.1* 116.3*  PLT 208  --  142* 165 174 225    Cardiac Enzymes:  Recent Labs Lab 01/28/16 2134  CKTOTAL 242    Lipid Panel: No results for input(s): CHOL, TRIG, HDL, CHOLHDL, VLDL, LDLCALC in the last 168 hours.  CBG:  Recent Labs Lab 01/26/16 2106 01/27/16 2013  GLUCAP 160* 132*    Microbiology: No results found for this or any previous visit.  Coagulation Studies: No results for input(s): LABPROT, INR in the last 72 hours.  Imaging: No results found.     Assessment and plan per attending neurologist  Albert Morn PA-C [facility] [phone]  01/31/2016, 3:23 PM   Assessment/Plan:  40 YO with history of significant ETOH abuse (drinks a pint of Vodka daily) now presenting with ETOH withdrawal and likely wernicke encephalopathy. He was only placed on low dose Thiamine on admission along with CIWA.  Exam is complicated by recent IV 2 mg Ativan.  He is well within the window of DT's. MRI head and EEG will  be ordered to evaluate.   Will discuss with Dr. Hilda Blades.  Neurology attending:  Agree with above. Depaul's mental status has changed since his evaluation this morning at which time he appeared normal. There are inconsistencies in his cognitive evaluation. Josephmichael is able to follow commands in all 4 extremities but is making atypical mistakes of what would normally be seen in someone with altered cognitive function. His speech at times was slurred and nonsensical. However, at other times he was able to answer questions correctly and was able to  comprehend and follow commands. It was mentioned to Norvell this morning that his evaluations have been completed and when he feels ready he should be able to go home. It is unknown to what extent that information may be contributing to the present symptoms.  Plan:  1. MRI of brain to rule out intracranial lesion or ischemic event.  2. EEG to rule out subclinical seizure activity  3. We will follow his progress.  Rose Fillers M.D. Neuro hospitalist

## 2016-01-31 NOTE — Progress Notes (Signed)
Physical Therapy Treatment Patient Details Name: Albert Lara MRN: [id number] DOB: [birth year] Today's Date: 01/31/2016    History of Present Illness Albert Lara a 39 y.o.malewith a past medical history significant for depression and alcohol use. Pt admitted with bilateral leg numbness and weakness, shooting severe pains in both calves, numbness/paresthesias in both fingers/hands, loss of vision.    PT Comments    Pt still not safe to be alone cognitively and functionally.  Balance is poor, insight is poor and not sure who, if anyone will be with him if he goes "home".  If pt can not go home, needs HHPT and RN follow up for safety.  Follow Up Recommendations  Other (comment);SNF;Supervision/Assistance - 24 hour (likely will go home with HHPT though)     Equipment Recommendations  Other (comment) (unable to determine on this treatment due to limited mobilit)    Recommendations for Other Services       Precautions / Restrictions Precautions Precautions: Fall Precaution Comments: pt is still unsteady, at risk for falls, but less so than on initial evaluation Restrictions Weight Bearing Restrictions: No    Mobility  Bed Mobility Overal bed mobility: Needs Assistance Bed Mobility: Supine to Sit;Sit to Supine     Supine to sit: Supervision Sit to supine: Supervision   General bed mobility comments: supervision for safety  Transfers Overall transfer level: Needs assistance Equipment used: 1 person hand held assist Transfers: Sit to/from Stand Sit to Stand: Supervision         General transfer comment: supervision for safety due to pt's impulsiveness and poor judgement  Ambulation/Gait Ambulation/Gait assistance: Min guard;Supervision Ambulation Distance (Feet): 30 Feet Assistive device: None Gait Pattern/deviations: Step-through pattern   Gait velocity interpretation: Below normal speed for age/gender General Gait Details: Mildly unsteady  overall with drifts and mild staggering during gait and wobbles with standing.  Episodes where guarding was necessary.   Stairs            Wheelchair Mobility    Modified Rankin (Stroke Patients Only)       Balance Overall balance assessment: Needs assistance   Sitting balance-Leahy Scale: Good       Standing balance-Leahy Scale: Poor                      Cognition Arousal/Alertness: Awake/alert Behavior During Therapy: Impulsive;Restless Overall Cognitive Status: Impaired/Different from baseline Area of Impairment: Memory;Safety/judgement;Problem solving Orientation Level: Disoriented to;Time;Situation;Place (knows he is in [place] only)   Memory: Decreased short-term memory   Safety/Judgement: Decreased awareness of safety;Decreased awareness of deficits   Problem Solving: Slow processing;Decreased initiation;Requires verbal cues General Comments: Pt talking about not moving OOB, because the alarm will go off, as an excuse to not get up.  But after session the alarms that he just talked about mean nothing to him as far as staying in the bed.    Exercises      General Comments General comments (skin integrity, edema, etc.): pt with poor cognition, poor safety insight, poor balance with moderate risk for falls      Pertinent Vitals/Pain Pain Assessment: Faces Pain Score: 8  Faces Pain Scale: No hurt Pain Location: legs Pain Intervention(s): Limited activity within patient's tolerance;Monitored during session;Repositioned    Home Living                      Prior Function            PT Goals (current goals can now  be found in the care plan section) Acute Rehab PT Goals Patient Stated Goal: "to get back to my kids in [place], [place], go back to work, and stop drinking" PT Goal Formulation: With patient Time For Goal Achievement: 02/04/16 Potential to Achieve Goals: Good Progress towards PT goals: Progressing toward goals     Frequency  Min 2X/week    PT Plan Current plan remains appropriate    Co-evaluation             End of Session   Activity Tolerance: Other (comment) (limited by cognition) Patient left: in bed;with bed alarm set;with call bell/phone within reach     Time: [id number] PT Time Calculation (min) (ACUTE ONLY): 20 min  Charges:  $Therapeutic Activity: 8-22 mins                    G Codes:      Travelle Mcclimans, Eliseo GumKenneth V 01/31/2016, 12:32 PM 01/31/2016  [place] BingKen Alois Mincer, PT [phone]  (pager)

## 2016-01-31 NOTE — Progress Notes (Signed)
CSW received consult for SNF. Pt is currently walking around the unit trying to leave to go smoke. Pt has no insurance and is not appropriate for SNF.  CSW signing off.  Albert Lara LCSWA [phone]

## 2016-01-31 NOTE — Progress Notes (Addendum)
[facility] with attention to Vit A injection that will need to be given at appointment scheduled for Thursday at 3:00  Received call back froom pharmacy that meds will be filled and available tomorrow after 1:00 including Vit A injection

## 2016-01-31 NOTE — Progress Notes (Signed)
Dr. Hilda BladesArmstrong was called twice and left messages to request another consult per Dr. Sunnie Nielsenegalado, due to change in condition (patient having hallucination, being more confused and restless than before. Will continue to monitor.

## 2016-01-31 NOTE — Progress Notes (Addendum)
PROGRESS NOTE    Albert Lara  ZOX:[id number] DOB: 1975/09/22 DOA: 01/26/2016 PCP: No primary care provider on file.    Brief Narrative: Albert Lara is a 40 y.o. male with a past medical history significant for depression and alcohol use who presents with multiple complaints for 2 months.  The patient has a hard time specifying the chronicity of his ailments, but somewhere over the last 2 months, he has had worsening bilateral leg numbness, progressing to shooting severe pains in both calves, numbness/paresthesias in both fingers/hands, loss of vision ("hazy" or "blurry" vision), and leg weakness to the point that he can't climb stairs this week.  He had been living with his mother in [place] until a few weeks ago, when he came to [place].  He has been drinking up to 1 quart of vodka daily for many months, "I barely eat" and had lost >50 lbs.  Does not use injection drugs.  No previous medical complaints, no daily medications, remote history of depression.  Tonight, a family member taking care of him here in [place] made him come to the ER    Assessment & Plan:   Principal Problem:   Acute hypokalemia Active Problems:   Hypomagnesemia   Abnormal levels of other serum enzymes   Vision changes   Hypocalcemia   Alcohol use disorder, severe, dependence (HCC)   Leg swelling   Peripheral neuropathy (HCC)   Hypokalemia   Malnutrition of moderate degree  1.  Neuropathy: unclear etiology  -Hgb-A1c 4.9, normal. - B12 level low normal, check MMA. Start supplements.  - HIV negative and  RPR negative,.  - Vitamin D 5.4 . RF negative, ANA negative , ANCA negative , Angiotensin C enzyme Normal , Sjogrens negative , MPO 3  negative , heavy metals, Protein electrophoresis no M spike , cryoglobulin pending .  B6  -TSH normal.  - ERS 22, CRP 2.4 .  -neurology consulted. Appreciate evaluation.  -Gabapentin 400 mg TID -star Vitamin D supplement.   1. Hypokalemia:  Suspect this  is acute or subacute from alcohol use and poor intake.    Given 70 mEQ K in ER. Resolved.   2. Confusion;  Confuse today with hallucination.  Will check Ammonia level, lactulose as needed.  Nurse will inform neuro of patient change.  Patient very confused, agitated, different to this morning. Ativan IV, transfer to step down for better monitoring.   Hypocalcemia and hypomagnesemia:  received calcium and mg supplement.  calcium corrected by albumin 7.6. Oral supplement Replace Mg IV   3. Transaminitis:  Likely from alcohol. -acute hepatitis panel negative and [place] hepatomegalia.   4. Anion gap elevation without acidosis by BMP:  Infection is not present. -Check lactic acid and osmolal gap  5. Alcohol use disorder, severe, desires to quit:  -CIWA scoring -Folate, thiamine, and MVI ordered -Consult to Social Work  6. Malnutrition: The patient has multiple complaints that appear at first to be related to malnutrition and electrolyte disturbances.  His neuropathy sounds as if it stretches back longer than the last 2 months, and so perhaps this is related to diabetes, which runs in the family.     7. Vision loss:  Suspect this is from malnutrition. Unclear etiology  -vitamin A level low at 23, thiamine 87 , folic acid 180  opthalmology consulted. Dr Charlotte Sanes recommend to check vitamin level and patient to follow in the office on Monday.  Lipase normal.  conjunctivitis; started  cipro. Improved.  Started vitamin A injection. He  will need 100,000 for 3 days then 50,000 for 14 days. (will need further dose ordered if remain in the hospital) 8. Leg swelling: Suspect again this is from malnutrition.   Doppler negative for DVT  Uric acid negative.   9. Anemia, normocytic, unclear cause: --iron 155, ferritin 1915 -folate pending. and B-12 low normal, check MMA.  -started  B 12 supplement.    10-HTN; started metoprolol./    DVT prophylaxis: lovenox.  Code Status: full code.    Family Communication: care discussed with patient.  Disposition Plan: to be determine    Consultants:   Opthalmology Dr Charlotte Sanes , recommend vitamin B 12, thiamine, PRP and patient to follow in her office next week.   Neurology     Procedures:   Doppler lower extremities negative for DVT   Antimicrobials:   none   Subjective: Pain better, he is confuse today, per nurse patient with hallucinations.  Asking about a cup and his car.     Objective: Vitals:   01/30/16 2152 01/31/16 0511 01/31/16 0603 01/31/16 1246  BP: (!) 129/102 (!) 136/105 (!) 136/98 104/78  Pulse: 97 (!) 112 (!) 108 (!) 108  Resp:  18  18  Temp:  99.2 F (37.3 C)  98.2 F (36.8 C)  TempSrc:  Oral  Oral  SpO2: 96%   99%  Weight:      Height:        Intake/Output Summary (Last 24 hours) at 01/31/16 1403 Last data filed at 01/31/16 1215  Gross per 24 hour  Intake              760 ml  Output              575 ml  Net              185 ml   Filed Weights   01/26/16 2014 01/27/16 0400  Weight: 66.2 kg (146 lb) 67.3 kg (148 lb 5.9 oz)    Examination:  General exam: Appears calm and comfortable  Respiratory system: Clear to auscultation. Respiratory effort normal. Cardiovascular system: S1 & S2 heard, RRR. No JVD, murmurs, rubs, gallops or clicks. No pedal edema. Gastrointestinal system: Abdomen is nondistended, soft and nontender. No organomegaly or masses felt. Normal bowel sounds heard. Central nervous system: No focal neurological deficits. Confuse today  Extremities: Symmetric 5 x 5 power. Skin: No rashes, lesions or ulcers     Data Reviewed: I have personally reviewed following labs and imaging studies  CBC:  Recent Labs Lab 01/26/16 2055 01/27/16 0240 01/27/16 0603 01/28/16 0841 01/29/16 0652  WBC 10.3  --  11.2* 11.0* 11.7*  NEUTROABS 6.2  --   --   --   --   HGB 11.2*  --  9.9* 10.1* 9.7*  HCT 30.2* 27.7* 28.5* 29.1* 28.9*  MCV 107.9*  --  112.6* 116.4* 115.1*  PLT 208   --  142* 165 174   Basic Metabolic Panel:  Recent Labs Lab 01/26/16 2055 01/26/16 2114 01/27/16 0603 01/27/16 1654 01/28/16 0841 01/29/16 0652 01/31/16 0741  NA 134*  --  136 136 135 135  --   K 1.9*  --  3.0* 3.9 4.0 3.8  --   CL 84*  --  92* 100* 102 101  --   CO2 31  --  30 28 24 26   --   GLUCOSE 134*  --  99 91 83 87  --   BUN 5*  --  <5* <5* <5* <  5*  --   CREATININE 0.55*  --  0.66 0.49* 0.49* 0.51*  --   CALCIUM 7.0*  --  6.6* 6.6* 7.0* 7.8*  --   MG  --  1.0*  --   --   --  1.3* 1.4*  PHOS  --  2.7  --   --   --   --   --    GFR: Estimated Creatinine Clearance: 118 mL/min (by C-G formula based on SCr of 0.8 mg/dL). Liver Function Tests:  Recent Labs Lab 01/26/16 2055 01/27/16 0603  AST 137* 122*  ALT 47 41  ALKPHOS 182* 181*  BILITOT 1.4* 1.4*  PROT 6.8 5.8*  ALBUMIN 3.3* 2.8*    Recent Labs Lab 01/27/16 1654  LIPASE 43  AMYLASE 98    Recent Labs Lab 01/31/16 1249  AMMONIA 64*   Coagulation Profile: No results for input(s): INR, PROTIME in the last 168 hours. Cardiac Enzymes:  Recent Labs Lab 01/28/16 2134  CKTOTAL 242   BNP (last 3 results) No results for input(s): PROBNP in the last 8760 hours. HbA1C: No results for input(s): HGBA1C in the last 72 hours. CBG:  Recent Labs Lab 01/26/16 2106 01/27/16 2013  GLUCAP 160* 132*   Lipid Profile: No results for input(s): CHOL, HDL, LDLCALC, TRIG, CHOLHDL, LDLDIRECT in the last 72 hours. Thyroid Function Tests: No results for input(s): TSH, T4TOTAL, FREET4, T3FREE, THYROIDAB in the last 72 hours. Anemia Panel: No results for input(s): VITAMINB12, FOLATE, FERRITIN, TIBC, IRON, RETICCTPCT in the last 72 hours. Sepsis Labs:  Recent Labs Lab 01/27/16 0235 01/27/16 0606 01/27/16 1135  LATICACIDVEN 2.6* 2.6* 2.3*    No results found for this or any previous visit (from the past 240 hour(s)).       Radiology Studies: No results found.      Scheduled Meds: . amitriptyline   25 mg Oral QHS  . calcium carbonate  1 tablet Oral BID WC  . ciprofloxacin  1 drop Both Eyes Q4H while awake  . enoxaparin (LOVENOX) injection  40 mg Subcutaneous Q24H  . famotidine  20 mg Oral BID  . feeding supplement  1 Container Oral TID BM  . folic acid  1 mg Oral Daily  . gabapentin  400 mg Oral TID  . hydrALAZINE  5 mg Intravenous Once  . magnesium oxide  400 mg Oral BID  . metoprolol tartrate  25 mg Oral BID  . multivitamin with minerals  1 tablet Oral Daily  . sodium chloride flush  3 mL Intravenous Q12H  . thiamine  100 mg Oral Daily  . vitamin A  100,000 Units Intramuscular Daily  . vitamin B-12  1,000 mcg Oral Daily  . Vitamin D (Ergocalciferol)  50,000 Units Oral Q7 days   Continuous Infusions:     LOS: 4 days    Time spent: 35 minutes.     Alba Coryegalado, Jahni Nazar A, MD [facility] Pager [phone]  If 7PM-7AM, please contact night-coverage [email or web address] Password [email or web address] 01/31/2016, 2:03 PM

## 2016-01-31 NOTE — Progress Notes (Signed)
Occupational Therapy Treatment Patient Details Name: Albert Lara MRN: [id number] DOB: Sep 30, 1975 Today's Date: 01/31/2016    History of present illness Albert Lara a 40 y.o.malewith a past medical history significant for depression and alcohol use. Pt admitted with bilateral leg numbness and weakness, shooting severe pains in both calves, numbness/paresthesias in both fingers/hands, loss of vision.   OT comments  Pt willing to participate with OT, but he was tangential and not oriented to place and situation this session.  Reoriented, but pt needs reinforcement.  Pain still 8, but pt reports it is much better than what it was.  He will need 24/7 or SNF for safe discharge, at this time  Follow Up Recommendations  SNF;Supervision/Assistance - 24 hour    Equipment Recommendations   (possibly shower/tub seat/bench:  to be further assesssed)    Recommendations for Other Services      Precautions / Restrictions Precautions Precautions: Fall Precaution Comments: pt is very unsteady Restrictions Weight Bearing Restrictions: No       Mobility Bed Mobility Overal bed mobility: Needs Assistance       Supine to sit: Supervision Sit to supine: Supervision      Transfers   Equipment used: 1 person hand held assist   Sit to Stand: Min guard;Min assist         General transfer comment: variable: close guard to steadying assistance    Balance     Sitting balance-Leahy Scale: Good       Standing balance-Leahy Scale: Poor                     ADL       Grooming: Oral care;Min guard;Standing               Lower Body Dressing: Min guard;Sit to/from stand                 General ADL Comments: Min A ambulating to sink with hand held assistance.  Decreased initiation of tasks.        Vision                     Perception     Praxis      Cognition   Behavior During Therapy: [facility] for tasks  assessed/performed Overall Cognitive Status: Impaired/Different from baseline Area of Impairment: Memory;Safety/judgement;Problem solving Orientation Level: Disoriented to;Time;Situation;Place (knows he is in [place] only)   Memory: Decreased short-term memory    Safety/Judgement: Decreased awareness of safety;Decreased awareness of deficits   Problem Solving: Slow processing;Decreased initiation;Requires verbal cues General Comments: Pt very tangential in thoughts and said he saw a baby in the hallway (family).  Oriented to [place], but not hospital.  Reoriented.  Pt kept attempting to get OOB when OT left to get him a beverage    Extremity/Trunk Assessment               Exercises     Shoulder Instructions       General Comments      Pertinent Vitals/ Pain       Pain Assessment: 0-10 Pain Score: 8  Pain Location: legs Pain Intervention(s): Limited activity within patient's tolerance;Monitored during session;Repositioned  Home Living                                          Prior Functioning/Environment  Frequency       Progress Toward Goals  OT Goals(current goals can now be found in the care plan section)  Progress towards OT goals: Progressing toward goals     Plan      Co-evaluation                 End of Session     Activity Tolerance Patient tolerated treatment well   Patient Left in bed;with call bell/phone within reach;with bed alarm set   Nurse Communication          Time: 1136-1150 OT Time Calculation (min): 14 min  Charges: OT General Charges $OT Visit: 1 Procedure OT Treatments $Self Care/Home Management : 8-22 mins  Albert Lara 01/31/2016, 12:19 PM Albert Lara, OTR/L [phone] 01/31/2016

## 2016-01-31 NOTE — Progress Notes (Signed)
Patient received pamphlet from [facility]. CM explained to patient that they may use the on site pharmacy to fill prescriptions given to them at discharge. Patient aware that the [facility] will not fill narcotics or pain medications prior to the patient being seen by one of their physicians.  Patient aware that they must be seen as a patient prior to the pharmacy filling the prescriptions a second time.  
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
## 2016-01-31 NOTE — Progress Notes (Signed)
[id number] Report given to RN French Anaracy. Patient belonging transferred with patient.

## 2016-01-31 NOTE — Progress Notes (Signed)
Patient's BP 136/105. On-call NP, Schorr notified.  Will continue to monitor Patient and await NP orders.

## 2016-02-01 ENCOUNTER — Inpatient Hospital Stay (HOSPITAL_COMMUNITY): Payer: Self-pay

## 2016-02-01 DIAGNOSIS — E876 Hypokalemia: Principal | ICD-10-CM

## 2016-02-01 DIAGNOSIS — G9340 Encephalopathy, unspecified: Secondary | ICD-10-CM

## 2016-02-01 DIAGNOSIS — E7220 Disorder of urea cycle metabolism, unspecified: Secondary | ICD-10-CM | POA: Clinically undetermined

## 2016-02-01 LAB — BASIC METABOLIC PANEL
Anion gap: 6 (ref 5–15)
CHLORIDE: 101 mmol/L (ref 101–111)
CO2: 25 mmol/L (ref 22–32)
CREATININE: 0.53 mg/dL — AB (ref 0.61–1.24)
Calcium: 8.2 mg/dL — ABNORMAL LOW (ref 8.9–10.3)
GFR calc Af Amer: >60 mL/min (ref >60)
GFR calc non Af Amer: >60 mL/min (ref >60)
GLUCOSE: 93 mg/dL (ref 65–99)
Potassium: 5 mmol/L (ref 3.5–5.1)
SODIUM: 132 mmol/L — AB (ref 135–145)

## 2016-02-01 LAB — HEPATIC FUNCTION PANEL
ALK PHOS: 165 U/L — AB (ref 38–126)
ALT: 38 U/L (ref 17–63)
AST: 73 U/L — ABNORMAL HIGH (ref 15–41)
Albumin: 2.8 g/dL — ABNORMAL LOW (ref 3.5–5.0)
BILIRUBIN DIRECT: 0.4 mg/dL (ref 0.1–0.5)
BILIRUBIN INDIRECT: 0.5 mg/dL (ref 0.3–0.9)
BILIRUBIN TOTAL: 0.9 mg/dL (ref 0.3–1.2)
Total Protein: 5.8 g/dL — ABNORMAL LOW (ref 6.5–8.1)

## 2016-02-01 LAB — CBC
HCT: 30.9 % — ABNORMAL LOW (ref 39.0–52.0)
HEMOGLOBIN: 10.2 g/dL — AB (ref 13.0–17.0)
MCH: 38.2 pg — ABNORMAL HIGH (ref 26.0–34.0)
MCHC: 33 g/dL (ref 30.0–36.0)
MCV: 115.7 fL — ABNORMAL HIGH (ref 78.0–100.0)
PLATELETS: 239 10*3/uL (ref 150–400)
RBC: 2.67 MIL/uL — ABNORMAL LOW (ref 4.22–5.81)
RDW: 16 % — AB (ref 11.5–15.5)
WBC: 10.1 10*3/uL (ref 4.0–10.5)

## 2016-02-01 LAB — AMMONIA: AMMONIA: 57 umol/L — AB (ref 9–35)

## 2016-02-01 LAB — METHYLMALONIC ACID(MMA), RND URINE
CREATININE(CRT), U: 0.27 g/L — AB (ref 0.30–3.00)
METHYLMALONIC ACID UR: 10.4 umol/L (ref 1.6–29.7)
MMA - Normalized: 4.4 umol/mmol cr — ABNORMAL HIGH (ref 0.4–2.5)

## 2016-02-01 LAB — MRSA PCR SCREENING: MRSA by PCR: NEGATIVE

## 2016-02-01 MED ORDER — LORAZEPAM 1 MG PO TABS: 0.0000 mg | ORAL_TABLET | Freq: Four times a day (QID) | ORAL | Status: DC

## 2016-02-01 MED ORDER — VITAMIN B-1 100 MG PO TABS
100.0000 mg | ORAL_TABLET | Freq: Every day | ORAL | Status: DC
  Administered 2016-02-02: 100 mg via ORAL
  Filled 2016-02-01: qty 1

## 2016-02-01 MED ORDER — LORAZEPAM 1 MG PO TABS
0.0000 mg | ORAL_TABLET | Freq: Two times a day (BID) | ORAL | Status: DC
Start: 2016-02-03 — End: 2016-02-02

## 2016-02-01 MED ORDER — THIAMINE HCL 100 MG/ML IJ SOLN
100.0000 mg | Freq: Every day | INTRAMUSCULAR | Status: DC
  Filled 2016-02-01: qty 2

## 2016-02-01 MED ORDER — LACTULOSE 10 GM/15ML PO SOLN
20.0000 g | Freq: Once | ORAL | Status: AC
Start: 2016-02-01 — End: 2016-02-01
  Administered 2016-02-01: 20 g via ORAL
  Filled 2016-02-01: qty 30

## 2016-02-01 MED ORDER — HYDRALAZINE HCL 50 MG PO TABS
50.0000 mg | ORAL_TABLET | Freq: Three times a day (TID) | ORAL | Status: DC | PRN
  Administered 2016-02-02: 50 mg via ORAL
  Filled 2016-02-01: qty 1

## 2016-02-01 NOTE — Progress Notes (Signed)
EEG Completed; Results Pending  

## 2016-02-01 NOTE — Procedures (Signed)
ELECTROENCEPHALOGRAM REPORT  Date of Study: 02/01/2016  Patient's Name: Albert Lara MRN: [id number] Date of Birth: December 22, 1975  Referring Provider: Felicie Mornavid Smith, PA-C  Clinical History: 40 y.o. male male with long history of ETOH abuse brought to the hospital on 8/3 for "worsening bilateral leg numbness, progressing to shooting severe pains in both calves, numbness/paresthesias in both fingers/hands, loss of vision ("hazy" or "blurry" vision), and leg weakness to the point that he can't climb stairs this week. During admission, he has had visual hallucinations  Medications: acetaminophen (TYLENOL) suppository 650 mg   L1 acetaminophen (TYLENOL) tablet 650 mg   amitriptyline (ELAVIL) tablet 25 mg   calcium carbonate (OS-CAL - dosed in mg of elemental calcium) tablet 500 mg of elemental calcium   ciprofloxacin (CILOXAN) 0.3 % ophthalmic solution 1 drop   enoxaparin (LOVENOX) injection 40 mg   famotidine (PEPCID) tablet 20 mg   feeding supplement (BOOST / RESOURCE BREEZE) liquid 1 Container   folic acid (FOLVITE) tablet 1 mg   gabapentin (NEURONTIN) capsule 400 mg   hydrALAZINE (APRESOLINE) injection 10 mg   hydrALAZINE (APRESOLINE) injection 5 mg   lactulose (CHRONULAC) 10 GM/15ML solution 30 g   LORazepam (ATIVAN) injection 2-3 mg   magnesium oxide (MAG-OX) tablet 400 mg   metoprolol tartrate (LOPRESSOR) tablet 25 mg   multivitamin with minerals tablet 1 tablet   polyvinyl alcohol (LIQUIFILM TEARS) 1.4 % ophthalmic solution 1 drop   sodium chloride flush (NS) 0.9 % injection 3 mL   thiamine (VITAMIN B-1) tablet 100 mg   thiamine 500mg  in normal saline (50ml) IVPB   traMADol (ULTRAM) tablet 50 mg   vitamin A (Aquasol-A) injection 100,000 Units   vitamin B-12 (CYANOCOBALAMIN) tablet 1,000 mcg   Vitamin D (Ergocalciferol) (DRISDOL) capsule 50,000 Units  Technical Summary: A multichannel digital EEG recording measured by the international 10-20 system with electrodes applied  with paste and impedances below 5000 ohms performed in our laboratory with EKG monitoring in an awake and drowsy patient.  Hyperventilation and photic stimulation were not performed.  The digital EEG was referentially recorded, reformatted, and digitally filtered in a variety of bipolar and referential montages for optimal display.    Description: The patient is awake and drowsy during the recording.  During maximal wakefulness, there is a symmetric, medium voltage 9 to 10 Hz posterior dominant rhythm that attenuates with eye opening.  The record is symmetric.  During drowsiness, there is an increase in theta slowing of the background.  Stage 2 sleep was not seen.  Hyperventilation and photic stimulation were not performed.  There were no epileptiform discharges or electrographic seizures seen.    EKG lead was unremarkable.  Impression: This awake and drowsy EEG is normal.    Clinical Correlation: A normal EEG does not exclude a clinical diagnosis of epilepsy.  If further clinical questions remain, prolonged EEG may be helpful.  Clinical correlation is advised.   Shon MilletAdam Jaffe, DO

## 2016-02-01 NOTE — Progress Notes (Signed)
Subjective:  Albert Lara is resting comfortably in bed. He is following commands by moving all 4 extremities. He just received Ativan for his MRI. He is not answering any questions at this time. His nursing team reports that last night he was getting up and walking about. He has been moved to the medicine stepdown  Exam: Vitals:   02/01/16 0634 02/01/16 0800  BP: 114/86 (!) 121/91  Pulse:    Resp: 14 14  Temp:  97.7 F (36.5 C)    HEENT-  Normocephalic, no lesions, without obvious abnormality.  Normal external eye and conjunctiva.  Normal TM's bilaterally.  Normal auditory canals and external ears. Normal external nose, mucus membranes and septum.  Normal pharynx. Cardiovascular- regular rate and rhythm, S1, S2 normal, no murmur, click, rub or gallop, pulses palpable throughout   Lungs- chest clear, no wheezing, rales, normal symmetric air entry, Heart exam - S1, S2 normal, no murmur, no gallop, rate regular Abdomen- soft, non-tender; bowel sounds normal; no masses,  no organomegaly Extremities- less then 2 second capillary refill Lymph-no adenopathy palpable Musculoskeletal-no joint tenderness, deformity or swelling Skin-warm and dry, no hyperpigmentation, vitiligo, or suspicious lesions    Gen: In bed, NAD MS: Albert Lara recently received Ativan for his MRI and remains a bit sedated at this point. However, he does follow commands in all 4 extremities. CN: Pupils are equal and reactive at 1-2 mm status post Ativan. No facial asymmetry. Motor: Spontaneous movement of all 4 extremities. Sensory: Grossly intact. DTR: 1-2+.  Pertinent Labs/Diagnostics: Reviewed    Impression:   Albert Lara began to have changes in his mental status yesterday. They were somewhat atypical in nature. He had an incoherent type of speech that appeared atypical. The nursing staff reported he was still able to get up out of his bed disabled his alarm and walk about in the room.  The MRI of the brain  has been completed and is unremarkable with no stigmata suggestive of Warneke encephalopathy. The nursing staff raise the concern of the possibility of delirium tremens. However [name] Lara was also noted to have normal cognitive function yesterday morning.  EEG is pending at this time. We will follow his cognitive exam closely.   Recommendations:  1. EEG as requested.  2. We will follow his progress with you.   Eaven Schwager A. Hilda BladesArmstrong, M.D. Neurohospitalist Phone: [phone]   02/01/2016, 8:28 AM

## 2016-02-01 NOTE — Plan of Care (Signed)
Problem: Health Behavior/Discharge Planning: Goal: Ability to manage health-related needs will improve Outcome: Progressing Patient acting out throughout the shift with new Ativan orders for CIWA Scale the patient has come down gradually overnight.  Patient currently calm for MRI procedure this morning.
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
## 2016-02-01 NOTE — Progress Notes (Signed)
Patient pulled out IV. Patient refused to allow nurse to start new IV. Attempted to educated patient on need for IV. Notified Dr. Melynda RippleHobbs.

## 2016-02-02 ENCOUNTER — Inpatient Hospital Stay: Payer: Self-pay

## 2016-02-02 ENCOUNTER — Encounter (HOSPITAL_COMMUNITY): Payer: Self-pay | Admitting: Emergency Medicine

## 2016-02-02 DIAGNOSIS — M79672 Pain in left foot: Secondary | ICD-10-CM | POA: Insufficient documentation

## 2016-02-02 DIAGNOSIS — F17210 Nicotine dependence, cigarettes, uncomplicated: Secondary | ICD-10-CM | POA: Insufficient documentation

## 2016-02-02 DIAGNOSIS — M79671 Pain in right foot: Secondary | ICD-10-CM | POA: Insufficient documentation

## 2016-02-02 DIAGNOSIS — Z5321 Procedure and treatment not carried out due to patient leaving prior to being seen by health care provider: Secondary | ICD-10-CM | POA: Insufficient documentation

## 2016-02-02 LAB — COMPREHENSIVE METABOLIC PANEL
ALT: 48 U/L (ref 17–63)
ANION GAP: 13 (ref 5–15)
AST: 99 U/L — ABNORMAL HIGH (ref 15–41)
Albumin: 3.3 g/dL — ABNORMAL LOW (ref 3.5–5.0)
Alkaline Phosphatase: 197 U/L — ABNORMAL HIGH (ref 38–126)
BUN: 5 mg/dL — ABNORMAL LOW (ref 6–20)
CHLORIDE: 102 mmol/L (ref 101–111)
CO2: 17 mmol/L — AB (ref 22–32)
CREATININE: 0.62 mg/dL (ref 0.61–1.24)
Calcium: 9 mg/dL (ref 8.9–10.3)
Glucose, Bld: 101 mg/dL — ABNORMAL HIGH (ref 65–99)
POTASSIUM: 4.4 mmol/L (ref 3.5–5.1)
SODIUM: 132 mmol/L — AB (ref 135–145)
Total Bilirubin: 0.7 mg/dL (ref 0.3–1.2)
Total Protein: 7.4 g/dL (ref 6.5–8.1)

## 2016-02-02 LAB — CBC WITH DIFFERENTIAL/PLATELET
BASOS ABS: 0.1 10*3/uL (ref 0.0–0.1)
BASOS PCT: 1 %
EOS ABS: 0.1 10*3/uL (ref 0.0–0.7)
Eosinophils Relative: 1 %
HCT: 33.1 % — ABNORMAL LOW (ref 39.0–52.0)
Hemoglobin: 11 g/dL — ABNORMAL LOW (ref 13.0–17.0)
LYMPHS ABS: 2.3 10*3/uL (ref 0.7–4.0)
Lymphocytes Relative: 22 %
MCH: 39.1 pg — AB (ref 26.0–34.0)
MCHC: 33.2 g/dL (ref 30.0–36.0)
MCV: 117.8 fL — ABNORMAL HIGH (ref 78.0–100.0)
Monocytes Absolute: 2 10*3/uL — ABNORMAL HIGH (ref 0.1–1.0)
Monocytes Relative: 19 %
NEUTROS ABS: 5.9 10*3/uL (ref 1.7–7.7)
Neutrophils Relative %: 57 %
Platelets: 285 10*3/uL (ref 150–400)
RBC: 2.81 MIL/uL — ABNORMAL LOW (ref 4.22–5.81)
RDW: 15.6 % — AB (ref 11.5–15.5)
WBC: 10.4 10*3/uL (ref 4.0–10.5)

## 2016-02-02 LAB — BASIC METABOLIC PANEL
Anion gap: 8 (ref 5–15)
BUN: 6 mg/dL (ref 6–20)
CHLORIDE: 102 mmol/L (ref 101–111)
CO2: 24 mmol/L (ref 22–32)
CREATININE: 0.52 mg/dL — AB (ref 0.61–1.24)
Calcium: 8.6 mg/dL — ABNORMAL LOW (ref 8.9–10.3)
GFR calc Af Amer: >60 mL/min (ref >60)
GLUCOSE: 106 mg/dL — AB (ref 65–99)
POTASSIUM: 4.4 mmol/L (ref 3.5–5.1)
SODIUM: 134 mmol/L — AB (ref 135–145)

## 2016-02-02 LAB — PHOSPHORUS: PHOSPHORUS: 3.6 mg/dL (ref 2.5–4.6)

## 2016-02-02 LAB — AMMONIA: Ammonia: 78 umol/L — ABNORMAL HIGH (ref 9–35)

## 2016-02-02 LAB — MAGNESIUM: MAGNESIUM: 1.7 mg/dL (ref 1.7–2.4)

## 2016-02-02 MED ORDER — LACTULOSE 10 GM/15ML PO SOLN: 30.0000 g | Freq: Three times a day (TID) | ORAL | 0 refills | Status: DC

## 2016-02-02 NOTE — Clinical Social Work Note (Signed)
CSW received call from RN that patient [place] [facility] [place]. He will not have a ride until tomorrow morning but patient discharging this afternoon. Patient does not have any financial resources to pay for his taxi and he cannot ride the bus.  Taxi voucher approved by Chiropodistassistant director of social work. Voucher given to RN.  Charlynn CourtSarah Shariff Lasky, CSW [phone]

## 2016-02-02 NOTE — Discharge Instructions (Signed)

## 2016-02-02 NOTE — Discharge Summary (Signed)
Physician Discharge Summary  Albert Lara ZOX:[id number] DOB: 06-Jul-1975 DOA: 01/26/2016  PCP: No primary care provider on file.  Admit date: 01/26/2016 Discharge date: 02/02/2016  Time spent: 30 minutes  Recommendations for Outpatient Follow-up:  1. Home 2. F/u with new PCP, call to make appt 3. F/u with Dr Charlotte Sanes, Ophthalmology   Discharge Diagnoses:  Principal Problem:   Acute hypokalemia Active Problems:   Hypomagnesemia   Abnormal levels of other serum enzymes   Vision changes   Hypocalcemia   Alcohol use disorder, severe, dependence (HCC)   Leg swelling   Peripheral neuropathy (HCC)   Hypokalemia   Malnutrition of moderate degree   Wernicke encephalopathy   Hyperammonemia (HCC)   Discharge Condition: stable  Diet recommendation: heart healthy  Filed Weights   01/31/16 2056 02/01/16 2329 02/02/16 0549  Weight: 45.2 kg (99 lb 10.4 oz) 65.8 kg (145 lb) 65.2 kg (143 lb 11.2 oz)    History of present illness:  Albert Lara is a 40 y.o. male with a past medical history significant for depression and alcohol use who presents with multiple complaints for 2 months.  The patient has a hard time specifying the chronicity of his ailments, but somewhere over the last 2 months, he has had worsening bilateral leg numbness, progressing to shooting severe pains in both calves, numbness/paresthesias in both fingers/hands, loss of vision ("hazy" or "blurry" vision), and leg weakness to the point that he can't climb stairs this week.  He had been living with his mother in [place] until a few weeks ago, when he came to [place].  He has been drinking up to 1 quart of vodka daily for many months, "I barely eat" and had lost >50 lbs.  Does not use injection drugs.  No previous medical complaints, no daily medications, remote history of depression.  Tonight, a family member taking care of him here in [place] made him come to the ER   Hospital Course:    Neuropathy:unclear  etiology  -Hgb-A1c 4.9, normal. - B12 level low normal, elevated MMA. Start supplements.  - HIV negative and  RPR negative,.  - Vitamin D 5.4 . RF negative, ANA negative , ANCA negative , Angiotensin C enzyme Normal , Sjogrens negative , MPO 3  negative , heavy metals, Protein electrophoresis no M spike , cryoglobulin pending .  B6 pending -TSH normal.  - ERS 22, CRP 2.4  -neurology consulted. Appreciate evaluation. Feel pts course complciated by presentation inconsisted with known medicla pathology when discharge dicused. Neg MRI and neg EEG. Cleared pt for d/c home. Needs f/u on outstanding B6 and cryoglobulin -Gabapentin 400 mg TID -start Vitamin D supplement.   Hypokalemia: Suspect this is acute or subacute from alcohol use and poor intake. Given 70 mEQ K in ER. Resolved.    Confusion;  Ammonia level elevated. Started on lactulose. Now with large volume BMs.  Recheck Ammonia in AM. ---8/8Patient very confused, agitated, different to this morning. Ativan IV, transfer to step down for better monitoring. Improved with lactulose even w/o big decrease in ammonia level.  Hypocalcemia and hypomagnesemia: received calcium and mg supplement.  calcium corrected by albumin 7.6. Oral supplement Replaced Mg IV and monitored  Transaminitis: Likely from alcohol. -acute hepatitis panel negative and [place] hepatomegalia.   Anion gap elevation without acidosis by BMP: Infection is not present.   Alcohol use disorder, severe, desires to quit: -CIWA scoring low, some sympathetic vitals but otherwise well per serial nursing exam -Folate, thiamine, and MVI ordered -Consult to  Social Work   Malnutrition: The patient has multiple complaints that appear at first to be related to malnutrition and electrolyte disturbances. His neuropathy sounds as if it stretches back longer than the last 2 months, and so perhaps this is related to diabetes, which runs in the family.    Vision  loss: Suspect this is from malnutrition. Unclear etiology  -vitamin A level low at 23 (borderline, started MVI), thiamine 87 , folic acid 180  opthalmology consulted. Dr Charlotte Sanes recommend to check vitamin level and patient to follow in the office on Monday.  Lipase normal.  conjunctivitis; started  cipro. Improved.  Started vitamin A injection. He will need 100,000 for 3 days then 50,000 for 14 days. (will need further dose ordered if remain in the hospital)  Leg swelling: Suspect again this is from malnutrition.  Doppler negative for DVT  Uric acid negative.    Anemia, normocytic, unclear cause: --iron 155, ferritin 1915 -folate pending. and B-12 low normal, check MMA.  -started  B 12 supplement.    HTN; started metoprolol. Effective.  Tachycardia Likely related to ETOH withdrawal Per pt baseline  ETOH abuse - cessation advised  Procedures: 01/27/16 Summary:  - No evidence of deep vein or superficial thrombosis involving the   right lower extremity and left lower extremity. - No evidence of Baker&'s cyst on the right or left.   Consultations:  Neuro  Discharge Exam: Vitals:   02/02/16 0549 02/02/16 1209  BP: (!) 133/94 126/90  Pulse: (!) 108 (!) 109  Resp: 18 20  Temp: 98.6 F (37 C) 98.6 F (37 C)     General:  No diaphoresis, anxious, no acute distress, A&Ox3  Cardiovascular: Regular rate and rhythm no murmurs rubs or gallops  Respiratory: Clear to auscultation bilaterally no more breathing  Abdomen: Nondistended bowel sounds normal nontender palpation  Musculoskeletal: Moving all extremities, no deformity, 5 out of 5 strength    Discharge Instructions    Current Discharge Medication List    START taking these medications   Details  amitriptyline (ELAVIL) 25 MG tablet Take 1 tablet (25 mg total) by mouth at bedtime. Qty: 30 tablet, Refills: 0    calcium carbonate (OS-CAL - DOSED IN MG OF ELEMENTAL CALCIUM) 1250 (500 Ca) MG tablet Take 1  tablet (500 mg of elemental calcium total) by mouth 2 (two) times daily with a meal. Qty: 60 tablet, Refills: 0    ciprofloxacin (CILOXAN) 0.3 % ophthalmic solution Place 1 drop into both eyes every 4 (four) hours while awake. Administer 1 drop, every 2 hours, while awake, for 2 days. Then 1 drop, every 4 hours, while awake, for the next 5 days. Qty: 5 mL, Refills: 0    feeding supplement (BOOST / RESOURCE BREEZE) LIQD Take 1 Container by mouth 3 (three) times daily between meals. Qty: 30 Container, Refills: 0    folic acid (FOLVITE) 1 MG tablet Take 1 tablet (1 mg total) by mouth daily. Qty: 30 tablet, Refills: 0    gabapentin (NEURONTIN) 400 MG capsule Take 1 capsule (400 mg total) by mouth 3 (three) times daily. Qty: 90 capsule, Refills: 0    lactulose (CHRONULAC) 10 GM/15ML solution Take 45 mLs (30 g total) by mouth 3 (three) times daily. Qty: 240 mL, Refills: 0    magnesium oxide (MAG-OX) 400 (241.3 Mg) MG tablet Take 1 tablet (400 mg total) by mouth 2 (two) times daily. Qty: 20 tablet, Refills: 0    metoprolol tartrate (LOPRESSOR) 25 MG tablet Take  1 tablet (25 mg total) by mouth 2 (two) times daily. Qty: 60 tablet, Refills: 0    Multiple Vitamin (MULTIVITAMIN WITH MINERALS) TABS tablet Take 1 tablet by mouth daily. Qty: 30 tablet, Refills: 0    thiamine 100 MG tablet Take 1 tablet (100 mg total) by mouth daily. Qty: 30 tablet, Refills: 0    traMADol (ULTRAM) 50 MG tablet Take 1 tablet (50 mg total) by mouth every 6 (six) hours as needed for moderate pain. Qty: 20 tablet, Refills: 0    vitamin A (AQUASOL-A) 50000 UNIT/ML injection Inject 2 mLs (100,000 Units total) into the muscle daily. Qty: 2 mL, Refills: 0    vitamin B-12 1000 MCG tablet Take 1 tablet (1,000 mcg total) by mouth daily. Qty: 5 tablet, Refills: 0    Vitamin D, Ergocalciferol, (DRISDOL) 50000 units CAPS capsule Take 1 capsule (50,000 Units total) by mouth every 7 (seven) days. Qty: 4 capsule, Refills: 0        No Known Allergies Follow-up Information    [facility]   Contact information: [address] [place] [address] [phone]        [facility]. Go today.   Specialty:  Behavioral Health Why:  If wanting more support and help with alcohol. Please follow up. Contact information: [address] [place] [address] [phone]        [facility]. Schedule an appointment as soon as possible for a visit today.   Why:  Please arrive 10 minutes early and bring your photo ID and any medications you are on. YOu may get your medications filled here at discharge.  Contact information: [address] [place] [address] [phone]           The results of significant diagnostics from this hospitalization (including imaging, microbiology, ancillary and laboratory) are listed below for reference.    Significant Diagnostic Studies: Mr Brain 31 Contrast  Result Date: 02/01/2016 CLINICAL DATA:  Initial evaluation for possible ward with the encephalopathy. Heavy history of dog off all abuse. EXAM: MRI HEAD WITHOUT CONTRAST TECHNIQUE: Multiplanar, multiecho pulse sequences of the brain and surrounding structures were obtained without intravenous contrast. COMPARISON:  None. FINDINGS: Study mildly degraded by motion artifact. Advanced cerebral atrophy for patient age. No significant cerebral white matter disease present. No abnormal foci of restricted diffusion to suggest acute ischemia. Gray-white matter differentiation maintained. Major intracranial vascular flow voids are preserved. No areas of chronic infarction. No acute or chronic intracranial hemorrhage. No mass lesion, midline shift, or mass effect. No hydrocephalus. No extra-axial fluid collection. Major dural sinuses are grossly patent. No signal changes to suggest acute Wernicke's encephalopathy identified.  Periaqueductal gray matter relatively normal. Mammillary bodies symmetric and within normal limits for size without significant swelling. Craniocervical junction within normal limits. Visualized upper cervical spine unremarkable. Pituitary gland within normal limits. No acute abnormality about the globes and orbits. Mild scattered mucosal thickening within the ethmoidal air cells and maxillary sinuses. Paranasal sinuses are otherwise clear. No significant mastoid effusion. Inner ear structures grossly normal. Bone marrow signal intensity grossly normal. No scalp soft tissue abnormality. IMPRESSION: 1. No acute intracranial process identified. No signal changes to suggest acute Wernicke's encephalopathy identified. 2. Advanced cerebral atrophy for patient age. Electronically Signed   By: Rise Mu M.D.   On: 02/01/2016 06:48   US Abdomen Limited Ruq  Result Date: 01/28/2016 CLINICAL DATA:  40 year old male EXAM:  US ABDOMEN LIMITED - RIGHT UPPER QUADRANT COMPARISON:  None. FINDINGS: Gallbladder: Lumen of the gallbladder is approximately 50% occupied with reflective microlithiasis/ sludge at the dependent aspects. Mild wall thickening with trace pericholecystic fluid. Negative sonographic Murphy's sign. Common bile duct: Diameter: 3 mm-4 mm Liver: Enlarged cranial caudal span of the liver with relatively heterogeneous echotexture. IMPRESSION: Cholelithiasis with approximately 50% of the lumen of the gallbladder replaced with microlithiasis/sludge. No sonographic evidence of acute cholecystitis. If there is ongoing concern for acute cholecystitis, correlation with lab values, presentation, and HIDA study is recommended. Hepatomegaly Signed, Yvone Neu. Loreta Ave, DO [facility] Electronically Signed   By: Gilmer Mor D.O.   On: 01/28/2016 10:22    Microbiology: Recent Results (from the past 240 hour(s))  MRSA PCR Screening     Status: None    Collection Time: 01/31/16 10:31 PM  Result Value Ref Range Status   MRSA by PCR NEGATIVE NEGATIVE Final    Comment:        The GeneXpert MRSA Assay (FDA approved for NASAL specimens only), is one component of a comprehensive MRSA colonization surveillance program. It is not intended to diagnose MRSA infection nor to guide or monitor treatment for MRSA infections.      Labs: Basic Metabolic Panel:  Recent Labs Lab 01/26/16 2114  01/28/16 0841 01/29/16 0652 01/31/16 0741 01/31/16 1433 02/01/16 0343 02/02/16 0330  NA  --   < > 135 135  --  134* 132* 134*  K  --   < > 4.0 3.8  --  5.0 5.0 4.4  CL  --   < > 102 101  --  102 101 102  CO2  --   < > 24 26  --  GLUCOSE  --   < > 83 87  --  111* 93 106*  BUN  --   < > <5* <5*  --  5* <5* 6  CREATININE  --   < > 0.49* 0.51*  --  0.56* 0.53* 0.52*  CALCIUM  --   < > 7.0* 7.8*  --  8.8* 8.2* 8.6*  MG 1.0*  --   --  1.3* 1.4*  --   --  1.7  PHOS 2.7  --   --   --   --   --   --  3.6  < > = values in this interval not displayed. Liver Function Tests:  Recent Labs Lab 01/26/16 2055 01/27/16 0603 02/01/16 0343  AST 137* 122* 73*  ALT 47 41 38  ALKPHOS 182* 181* 165*  BILITOT 1.4* 1.4* 0.9  PROT 6.8 5.8* 5.8*  ALBUMIN 3.3* 2.8* 2.8*    Recent Labs Lab 01/27/16 1654  LIPASE 43  AMYLASE 98    Recent Labs Lab 01/31/16 1249 02/01/16 0342 02/02/16 0236  AMMONIA 64* 57* 78*   CBC:  Recent Labs Lab 01/26/16 2055  01/28/16 0841 01/29/16 0652 01/31/16 1433 02/01/16 0343 02/02/16 0330  WBC 10.3  < > 11.0* 11.7* 11.3* 10.1 10.4  NEUTROABS 6.2  --   --   --   --   --  5.9  HGB 11.2*  < > 10.1* 9.7* 10.5* 10.2* 11.0*  HCT 30.2*  < > 29.1* 28.9* 31.4* 30.9* 33.1*  MCV 107.9*  < > 116.4* 115.1* 116.3* 115.7* 117.8*  PLT 208  < > 165 174 225 239 285  < > = values in this interval not displayed. Cardiac Enzymes:  Recent Labs Lab 01/28/16 2134  CKTOTAL 242   BNP: BNP (last 3 results)  Recent Labs   01/26/16 2055  BNP 51.3    ProBNP (last 3 results) No results for input(s): PROBNP in the last 8760 hours.  CBG:  Recent Labs Lab 01/26/16 2106 01/27/16 2013  GLUCAP 160* 132*       Signed:  Haydee SalterPhillip M Hobbs MD  FACP  [facility] 02/02/2016, 5:17 PM

## 2016-02-02 NOTE — Progress Notes (Signed)
[facility]. Next available is next week, staff states someone would have to call Monday to schedule that.

## 2016-02-02 NOTE — ED Triage Notes (Signed)
PT st's he was discharged from hosp earlier today st's after he got home bil feet started hurting again.  Pt admits to ETOH after being discharged

## 2016-02-02 NOTE — Progress Notes (Signed)
Patient ambulating on the department this morning and was alert and oriented during conversation. Patient requesting charger for cell phone. Patient was very understanding and was given a Consulting civil engineercharger. Patient verbalized understanding of safety precautions that prevents him from leaving the department to smoke. Dr.Hobbs updated on patient's status.

## 2016-02-02 NOTE — Progress Notes (Signed)
Subjective:  Albert Lara appears back to normal this morning. He is awake alert and oriented and answering questions appropriately. He presents no explanation for his symptoms over the past 48 hours. He has no focal neurological deficits. He continues to complain of neuropathy. He states he would like to consider going home tomorrow morning. His MRI and EEG were both normal.  Exam: Vitals:   02/01/16 2329 02/02/16 0549  BP: 100/78 (!) 133/94  Pulse: (!) 107 (!) 108  Resp: 18 18  Temp: 99.1 F (37.3 C) 98.6 F (37 C)    HEENT-  Normocephalic, no lesions, without obvious abnormality.  Normal external eye and conjunctiva.  Normal TM's bilaterally.  Normal auditory canals and external ears. Normal external nose, mucus membranes and septum.  Normal pharynx. Cardiovascular- regular rate and rhythm, S1, S2 normal, no murmur, click, rub or gallop, pulses palpable throughout   Lungs- chest clear, no wheezing, rales, normal symmetric air entry, Heart exam - S1, S2 normal, no murmur, no gallop, rate regular Abdomen- soft, non-tender; bowel sounds normal; no masses,  no organomegaly Extremities- mild swelling of the feet. Lymph-no adenopathy palpable Musculoskeletal-no joint tenderness, deformity or swelling Skin-warm and dry, no hyperpigmentation, vitiligo, or suspicious lesions    Gen: In bed, NAD MS: Awake, alert and oriented 3. CN: Cranial nerves II through XII are intact. Motor: Minimal weakness of finger abduction and foot dorsiflexion. Sensory: Decreased distally in all 4 extremities. DTR: 1+ bilaterally.  Pertinent Labs/Diagnostics: Reviewed    Impression:   1. Neuropathy: It is suspected this is likely related to the alcohol consumption. The inflammatory markers were nonspecific and mildly elevated.  2. Episode of confusion: This has abruptly resolved overnight. The presentation does not appear consistent with wart acute encephalopathy or delirium tremens. It is suspected that  it might have been stress related. The ammonia is noted to be slightly elevated. However Tarell's cognitive status has returned to normal at this time.   Recommendations:  1. Discharge to home with follow-up with primary care physician.   James A. Hilda BladesArmstrong, M.D. Neurohospitalist Phone: [phone]   02/02/2016, 8:41 AM

## 2016-02-02 NOTE — Progress Notes (Signed)
OT Cancellation Note  Patient Details Name: Albert PoliceChristopher Bruns MRN: [id number] DOB: [birth year]   Cancelled Treatment:    Reason Eval/Treat Not Completed: Fatigue/lethargy limiting ability to participate  Pt states he has already walked and showered this am and wants to stay in bed at this time. Will recheck on pt later in day or next day.  Lise AuerLori Braydyn Schultes, [place] [phone]  Einar CrowEDDING, Quindell Shere D 02/02/2016, 11:52 AM

## 2016-02-03 ENCOUNTER — Emergency Department (HOSPITAL_COMMUNITY)
Admission: EM | Admit: 2016-02-03 | Discharge: 2016-02-03 | Disposition: A | Discharge status: Home / Self Care | Payer: Self-pay | Attending: Physician Assistant | Admitting: Physician Assistant

## 2016-02-03 LAB — CBC WITH DIFFERENTIAL/PLATELET
BASOS PCT: 1 %
Basophils Absolute: 0.1 10*3/uL (ref 0.0–0.1)
EOS ABS: 0.1 10*3/uL (ref 0.0–0.7)
EOS PCT: 1 %
HCT: 34.5 % — ABNORMAL LOW (ref 39.0–52.0)
Hemoglobin: 11.8 g/dL — ABNORMAL LOW (ref 13.0–17.0)
LYMPHS PCT: 28 %
Lymphs Abs: 3 10*3/uL (ref 0.7–4.0)
MCH: 38.6 pg — AB (ref 26.0–34.0)
MCHC: 34.2 g/dL (ref 30.0–36.0)
MCV: 112.7 fL — AB (ref 78.0–100.0)
Monocytes Absolute: 2 10*3/uL — ABNORMAL HIGH (ref 0.1–1.0)
Monocytes Relative: 19 %
NEUTROS ABS: 5.5 10*3/uL (ref 1.7–7.7)
NEUTROS PCT: 51 %
PLATELETS: 382 10*3/uL (ref 150–400)
RBC: 3.06 MIL/uL — ABNORMAL LOW (ref 4.22–5.81)
RDW: 15.6 % — AB (ref 11.5–15.5)
WBC: 10.7 10*3/uL — ABNORMAL HIGH (ref 4.0–10.5)

## 2016-02-03 LAB — VITAMIN B6: VITAMIN B6: 3 ug/L — AB (ref 5.3–46.7)

## 2016-02-03 NOTE — ED Notes (Signed)
Pt called for room x2. No answer.  

## 2016-02-03 NOTE — ED Notes (Addendum)
Pt called for vitals recheck x2. No answer.  

## 2016-02-06 LAB — CRYOGLOBULIN

## 2016-02-11 ENCOUNTER — Encounter (HOSPITAL_BASED_OUTPATIENT_CLINIC_OR_DEPARTMENT_OTHER): Payer: Self-pay | Admitting: Emergency Medicine

## 2016-02-11 ENCOUNTER — Emergency Department (HOSPITAL_BASED_OUTPATIENT_CLINIC_OR_DEPARTMENT_OTHER): Payer: Self-pay

## 2016-02-11 ENCOUNTER — Observation Stay (HOSPITAL_BASED_OUTPATIENT_CLINIC_OR_DEPARTMENT_OTHER)
Admission: EM | Admit: 2016-02-11 | Discharge: 2016-02-13 | Disposition: A | Discharge status: Home / Self Care | Payer: Self-pay | Attending: Internal Medicine | Admitting: Internal Medicine

## 2016-02-11 DIAGNOSIS — F1020 Alcohol dependence, uncomplicated: Secondary | ICD-10-CM | POA: Diagnosis present

## 2016-02-11 DIAGNOSIS — R7989 Other specified abnormal findings of blood chemistry: Secondary | ICD-10-CM | POA: Diagnosis present

## 2016-02-11 DIAGNOSIS — F1010 Alcohol abuse, uncomplicated: Secondary | ICD-10-CM

## 2016-02-11 DIAGNOSIS — E440 Moderate protein-calorie malnutrition: Secondary | ICD-10-CM | POA: Insufficient documentation

## 2016-02-11 DIAGNOSIS — R000 Tachycardia, unspecified: Secondary | ICD-10-CM

## 2016-02-11 DIAGNOSIS — R252 Cramp and spasm: Secondary | ICD-10-CM | POA: Insufficient documentation

## 2016-02-11 DIAGNOSIS — F17210 Nicotine dependence, cigarettes, uncomplicated: Secondary | ICD-10-CM | POA: Insufficient documentation

## 2016-02-11 DIAGNOSIS — Z79899 Other long term (current) drug therapy: Secondary | ICD-10-CM | POA: Insufficient documentation

## 2016-02-11 DIAGNOSIS — E872 Acidosis: Secondary | ICD-10-CM | POA: Insufficient documentation

## 2016-02-11 DIAGNOSIS — G629 Polyneuropathy, unspecified: Secondary | ICD-10-CM | POA: Insufficient documentation

## 2016-02-11 DIAGNOSIS — E876 Hypokalemia: Principal | ICD-10-CM | POA: Insufficient documentation

## 2016-02-11 DIAGNOSIS — F10939 Alcohol use, unspecified with withdrawal, unspecified: Secondary | ICD-10-CM | POA: Diagnosis present

## 2016-02-11 DIAGNOSIS — F10239 Alcohol dependence with withdrawal, unspecified: Secondary | ICD-10-CM | POA: Insufficient documentation

## 2016-02-11 DIAGNOSIS — K7010 Alcoholic hepatitis without ascites: Secondary | ICD-10-CM | POA: Insufficient documentation

## 2016-02-11 HISTORY — DX: Polyneuropathy, unspecified: G62.9

## 2016-02-11 HISTORY — DX: Hypokalemia: E87.6

## 2016-02-11 HISTORY — DX: Alcohol abuse, uncomplicated: F10.10

## 2016-02-11 LAB — CBC WITH DIFFERENTIAL/PLATELET
BASOS ABS: 0.2 10*3/uL — AB (ref 0.0–0.1)
BASOS PCT: 1 %
Eosinophils Absolute: 0.1 10*3/uL (ref 0.0–0.7)
Eosinophils Relative: 1 %
HEMATOCRIT: 33.7 % — AB (ref 39.0–52.0)
HEMOGLOBIN: 12.3 g/dL — AB (ref 13.0–17.0)
Lymphocytes Relative: 22 %
Lymphs Abs: 3.3 10*3/uL (ref 0.7–4.0)
MCH: 37.8 pg — ABNORMAL HIGH (ref 26.0–34.0)
MCHC: 36.5 g/dL — ABNORMAL HIGH (ref 30.0–36.0)
MCV: 103.7 fL — ABNORMAL HIGH (ref 78.0–100.0)
Monocytes Absolute: 1.1 10*3/uL — ABNORMAL HIGH (ref 0.1–1.0)
Monocytes Relative: 7 %
NEUTROS ABS: 10.6 10*3/uL — AB (ref 1.7–7.7)
NEUTROS PCT: 69 %
Platelets: 468 10*3/uL — ABNORMAL HIGH (ref 150–400)
RBC: 3.25 MIL/uL — AB (ref 4.22–5.81)
RDW: 14.9 % (ref 11.5–15.5)
WBC: 15.3 10*3/uL — AB (ref 4.0–10.5)

## 2016-02-11 LAB — URINALYSIS, ROUTINE W REFLEX MICROSCOPIC
BILIRUBIN URINE: NEGATIVE
Glucose, UA: NEGATIVE mg/dL
Hgb urine dipstick: NEGATIVE
KETONES UR: NEGATIVE mg/dL
Leukocytes, UA: NEGATIVE
NITRITE: NEGATIVE
Protein, ur: NEGATIVE mg/dL
SPECIFIC GRAVITY, URINE: 1.01 (ref 1.005–1.030)
pH: 6.5 (ref 5.0–8.0)

## 2016-02-11 LAB — COMPREHENSIVE METABOLIC PANEL
ALK PHOS: 191 U/L — AB (ref 38–126)
ALT: 52 U/L (ref 17–63)
ANION GAP: 14 (ref 5–15)
AST: 108 U/L — ABNORMAL HIGH (ref 15–41)
Albumin: 3.3 g/dL — ABNORMAL LOW (ref 3.5–5.0)
BILIRUBIN TOTAL: 0.7 mg/dL (ref 0.3–1.2)
BUN: 3 mg/dL — ABNORMAL LOW (ref 6–20)
CALCIUM: 8.5 mg/dL — AB (ref 8.9–10.3)
CO2: 24 mmol/L (ref 22–32)
Chloride: 95 mmol/L — ABNORMAL LOW (ref 101–111)
Creatinine, Ser: 0.51 mg/dL — ABNORMAL LOW (ref 0.61–1.24)
GFR calc non Af Amer: >60 mL/min (ref >60)
Glucose, Bld: 113 mg/dL — ABNORMAL HIGH (ref 65–99)
Potassium: 2.6 mmol/L — CL (ref 3.5–5.1)
SODIUM: 133 mmol/L — AB (ref 135–145)
TOTAL PROTEIN: 7 g/dL (ref 6.5–8.1)

## 2016-02-11 LAB — MAGNESIUM: Magnesium: 1.3 mg/dL — ABNORMAL LOW (ref 1.7–2.4)

## 2016-02-11 LAB — I-STAT CG4 LACTIC ACID, ED: LACTIC ACID, VENOUS: 2.29 mmol/L — AB (ref 0.5–1.9)

## 2016-02-11 MED ORDER — POTASSIUM CHLORIDE CRYS ER 20 MEQ PO TBCR
40.0000 meq | EXTENDED_RELEASE_TABLET | Freq: Once | ORAL | Status: AC
  Administered 2016-02-11: 40 meq via ORAL
  Filled 2016-02-11: qty 2

## 2016-02-11 MED ORDER — POTASSIUM CHLORIDE 10 MEQ/100ML IV SOLN
10.0000 meq | Freq: Once | INTRAVENOUS | Status: AC
  Administered 2016-02-11: 10 meq via INTRAVENOUS
  Filled 2016-02-11: qty 100

## 2016-02-11 MED ORDER — MAGNESIUM SULFATE 2 GM/50ML IV SOLN
2.0000 g | Freq: Once | INTRAVENOUS | Status: AC
  Administered 2016-02-11: 2 g via INTRAVENOUS
  Filled 2016-02-11: qty 50

## 2016-02-11 MED ORDER — POTASSIUM CHLORIDE CRYS ER 20 MEQ PO TBCR: 40.0000 meq | EXTENDED_RELEASE_TABLET | Freq: Once | ORAL | Status: DC

## 2016-02-11 MED ORDER — MORPHINE SULFATE (PF) 2 MG/ML IV SOLN
2.0000 mg | Freq: Once | INTRAVENOUS | Status: AC
  Administered 2016-02-12: 2 mg via INTRAVENOUS
  Filled 2016-02-11: qty 1

## 2016-02-11 NOTE — ED Notes (Signed)
C/o leg cramping, "feel dehydrated", relates to ETOH use/abuse, "ready to quit, wants help", h/o recent low K, (denies: exertion, nvd, fever, bleeding, dizziness or other sx). "only pain is leg cramping".

## 2016-02-11 NOTE — ED Triage Notes (Signed)
Patient states that he was here about 2 -3 weeks ago decreased potassium - patient reports that he is having the same feeling with numbness and tingling to his bilateral legs and arms

## 2016-02-11 NOTE — ED Notes (Signed)
Up to b/r, slow cautious unsteady gait. Up with assistance.

## 2016-02-11 NOTE — ED Notes (Signed)
K level of 2.6 MD aware.

## 2016-02-11 NOTE — ED Notes (Signed)
[facility], pt alert, NAD, calm, interactive, resps e/u, "feel a little better".

## 2016-02-11 NOTE — Progress Notes (Signed)
Called by Dr. Radford PaxBeaton at 9:45PM 40 yo alcoholic, recently admitted for malnutrition/alcoholism (had several fat and water soluble vitamin deficiencies and hypokalemia, hypomagnesemia, hypocalcemia) who has been drinking again, last 1pm today.  Afterwards, felt heart racing, malaise, came to get checked out.  K 2.6, Sod 133, Mag 1.3 Got 2g mag, 60 K ECG 120, QTc 464, sinus, QRS normal  T 99.41F, WBC 15K, HR 120 --> ER thought no infection, but will obtain CXR, UA, lactate blood cultures and call back.  Then likely to Tele bed.   Addendum 10:45P: CXR and UA unremarkable.  Lactate up, consistent with alcohol use.  Will go to Tele bed, OBS status. Replete electrolytes and determine if he needs Neph consult now or if this is just more of same (alcoholism and malnutrition).

## 2016-02-11 NOTE — ED Provider Notes (Signed)
[facility]-EMERGENCY DEPT Provider Note   CSN: [id number] Arrival date & time: 02/11/16  1900  By signing my name below, I, Aggie MoatsJenny Song, attest that this documentation has been prepared under the direction and in the presence of Nelva Nayobert Corine Solorio, MD. Electronically Signed: Aggie MoatsJenny Song, ED Scribe. 02/11/16. 8:33 PM.    History   Chief Complaint Chief Complaint  Patient presents with  . Tachycardia    The history is provided by the patient. No language interpreter was used.   HPI Comments:  Albert Lara is a 40 y.o. male with a history of alcohol abuse and peripheral neuropathy who presents to the Emergency Department complaining of numbness and tingling in all four extremities, which started a couple hours ago. Pt reports that he was seen in the ED 2-3 weeks ago for similar symptoms and had decreased potassium levels. No associated symptoms or modifying factors. His last drink of vodka was at 1 PM this afternoon. Denies vomiting or diarrhea. No other drug use.  Past Medical History:  Diagnosis Date  . ETOH abuse   . GERD (gastroesophageal reflux disease)   . Hypokalemia   . Peripheral neuropathy [facility](HCC)     Patient Active Problem List   Diagnosis Date Noted  . Acute confusional state   . Essential hypertension 02/15/2016  . Leukocytosis 02/15/2016  . Macrocytic anemia 02/15/2016  . High serum lactate 02/12/2016  . Alcohol withdrawal (HCC) 02/12/2016  . Hyperammonemia (HCC) 02/01/2016  . Wernicke encephalopathy   . Hypomagnesemia 01/27/2016  . Vision changes 01/27/2016  . Hypocalcemia 01/27/2016  . Alcohol use disorder, severe, dependence (HCC) 01/27/2016  . Leg swelling 01/27/2016  . Peripheral neuropathy (HCC) 01/27/2016  . Hypokalemia 01/27/2016  . Malnutrition of moderate degree 01/27/2016  . Acute hypokalemia 01/26/2016  . Abnormal levels of other serum enzymes 12/22/2013    Past Surgical History:  Procedure Laterality Date  . TONSILLECTOMY AND ADENOIDECTOMY  ~  1988       Home Medications    Prior to Admission medications   Medication Sig Start Date End Date Taking? Authorizing Provider  amitriptyline (ELAVIL) 25 MG tablet Take 1 tablet (25 mg total) by mouth at bedtime. Patient not taking: Reported on 02/12/2016 01/31/16   Belkys A Regalado, MD  calcium carbonate (OS-CAL - DOSED IN MG OF ELEMENTAL CALCIUM) 1250 (500 Ca) MG tablet Take 1 tablet (500 mg of elemental calcium total) by mouth 2 (two) times daily with a meal. Patient not taking: Reported on 02/12/2016 01/31/16   Belkys A Regalado, MD  feeding supplement (BOOST / RESOURCE BREEZE) LIQD Take 1 Container by mouth 3 (three) times daily between meals. Patient not taking: Reported on 02/12/2016 01/31/16   Alba CoryBelkys A Regalado, MD  folic acid (FOLVITE) 1 MG tablet Take 1 tablet (1 mg total) by mouth daily. Patient not taking: Reported on 02/12/2016 01/31/16   Belkys A Regalado, MD  gabapentin (NEURONTIN) 400 MG capsule Take 1 capsule (400 mg total) by mouth 3 (three) times daily. Patient not taking: Reported on 02/12/2016 01/31/16   Belkys A Regalado, MD  lactulose (CHRONULAC) 10 GM/15ML solution Take 45 mLs (30 g total) by mouth 3 (three) times daily. 02/19/16   Rodolph Bonganiel V Thompson, MD  magnesium oxide (MAG-OX) 400 (241.3 Mg) MG tablet Take 1 tablet (400 mg total) by mouth 2 (two) times daily. Patient not taking: Reported on 02/12/2016 01/31/16   Belkys A Regalado, MD  metoprolol tartrate (LOPRESSOR) 25 MG tablet Take 1 tablet (25 mg total) by mouth 2 (two)  times daily. Patient not taking: Reported on 02/12/2016 01/31/16   Belkys A Regalado, MD  Multiple Vitamin (MULTIVITAMIN WITH MINERALS) TABS tablet Take 1 tablet by mouth daily. Patient not taking: Reported on 02/12/2016 01/31/16   Belkys A Regalado, MD  nicotine (NICODERM CQ - DOSED IN MG/24 HOURS) 21 mg/24hr patch Place 1 patch (21 mg total) onto the skin daily. 02/19/16   Rodolph Bong, MD  thiamine 100 MG tablet Take 1 tablet (100 mg total) by mouth  daily. Patient not taking: Reported on 02/12/2016 01/31/16   Belkys A Regalado, MD  traMADol (ULTRAM) 50 MG tablet Take 1 tablet (50 mg total) by mouth every 6 (six) hours as needed for moderate pain. Patient not taking: Reported on 02/12/2016 01/31/16   Belkys A Regalado, MD  vitamin B-12 1000 MCG tablet Take 1 tablet (1,000 mcg total) by mouth daily. Patient not taking: Reported on 02/12/2016 01/31/16   Belkys A Regalado, MD  Vitamin D, Ergocalciferol, (DRISDOL) 50000 units CAPS capsule Take 1 capsule (50,000 Units total) by mouth every 7 (seven) days. Patient not taking: Reported on 02/12/2016 01/31/16   Alba Cory, MD    Family History Family History  Problem Relation Age of Onset  . Diabetes Mother   . Hypertension Mother     Social History Social History  Substance Use Topics  . Smoking status: Current Every Day Smoker    Packs/day: 1.00    Years: 21.00    Types: Cigarettes  . Smokeless tobacco: Never Used  . Alcohol use 134.4 oz/week    224 Shots of liquor per week     Comment: 02/15/2016 "~ 1 quart of vodka/day" last drink was  02/11/2016"     Allergies   Review of patient's allergies indicates no known allergies.   Review of Systems Review of Systems  Gastrointestinal: Negative for diarrhea and vomiting.  Neurological: Positive for numbness.       Numbness and tingling in all four extremities.  All other systems reviewed and are negative.    Physical Exam Updated Vital Signs BP (!) 146/95 (BP Location: Right Arm)   Pulse 99   Temp 98 F (36.7 C) (Oral)   Resp 18   Ht 5\' 8"  (1.727 m)   SpO2 100%   Physical Exam Physical Exam  Nursing note and vitals reviewed. Constitutional: He is oriented to person, place, and time. He appears well-developed and well-nourished. No distress.  Patient was noted to be hypertensive and tachycardic.   HENT:  Head: Normocephalic and atraumatic.  Eyes: Pupils are equal, round, and reactive to light.  Neck: Normal range of  motion.  Cardiovascular: Sinus tachycardia and intact distal pulses.   Pulmonary/Chest: No respiratory distress.  Abdominal: Normal appearance. He exhibits no distension.  Musculoskeletal: Normal range of motion.  Neurological: He is alert and oriented to person, place, and time. No cranial nerve deficit.  Skin: Skin is warm and dry. No rash noted.  Psychiatric: He has a normal mood and affect. His behavior is normal.    ED Treatments / Results  DIAGNOSTIC STUDIES:  Oxygen Saturation is 97% on room air, normal by my interpretation.    COORDINATION OF CARE:  8:30 PM Discussed treatment plan with pt at bedside and pt agreed to plan.  Labs (all labs ordered are listed, but only abnormal results are displayed) Labs Reviewed  COMPREHENSIVE METABOLIC PANEL - Abnormal; Notable for the following:       Result Value   Sodium 133 (*)  Potassium 2.6 (*)    Chloride 95 (*)    Glucose, Bld 113 (*)    BUN 3 (*)    Creatinine, Ser 0.51 (*)    Calcium 8.5 (*)    Albumin 3.3 (*)    AST 108 (*)    Alkaline Phosphatase 191 (*)    All other components within normal limits  CBC WITH DIFFERENTIAL/PLATELET - Abnormal; Notable for the following:    WBC 15.3 (*)    RBC 3.25 (*)    Hemoglobin 12.3 (*)    HCT 33.7 (*)    MCV 103.7 (*)    MCH 37.8 (*)    MCHC 36.5 (*)    Platelets 468 (*)    Neutro Abs 10.6 (*)    Monocytes Absolute 1.1 (*)    Basophils Absolute 0.2 (*)    All other components within normal limits  MAGNESIUM - Abnormal; Notable for the following:    Magnesium 1.3 (*)    All other components within normal limits  BASIC METABOLIC PANEL - Abnormal; Notable for the following:    Glucose, Bld 126 (*)    BUN <5 (*)    Calcium 8.4 (*)    All other components within normal limits  CBC - Abnormal; Notable for the following:    WBC 14.0 (*)    RBC 2.93 (*)    Hemoglobin 10.9 (*)    HCT 32.1 (*)    MCV 109.6 (*)    MCH 37.2 (*)    RDW 16.1 (*)    All other components  within normal limits  I-STAT CG4 LACTIC ACID, ED - Abnormal; Notable for the following:    Lactic Acid, Venous 2.29 (*)    All other components within normal limits  CULTURE, BLOOD (ROUTINE X 2)  CULTURE, BLOOD (ROUTINE X 2)  URINALYSIS, ROUTINE W REFLEX MICROSCOPIC (NOT AT [facility])  MAGNESIUM  ETHANOL  VITAMIN B12  FOLATE  LACTIC ACID, PLASMA    EKG  EKG Interpretation  Date/Time:  Saturday February 11 2016 19:22:12 EDT Ventricular Rate:  120 PR Interval:    QRS Duration: 71 QT Interval:  328 QTC Calculation: 464 R Axis:     Text Interpretation:  Sinus tachycardia Anterior infarct, old No significant change since last tracing Confirmed by Tanairi Cypert  MD, Keesha Pellum [id number]) on 02/11/2016 8:20:35 PM       Radiology No results found.  Procedures Procedures (including critical care time)  Medications Ordered in ED Medications  potassium chloride 10 mEq in 100 mL IVPB (0 mEq Intravenous Stopped 02/11/16 2200)  magnesium sulfate IVPB 2 g 50 mL (0 g Intravenous Stopped 02/11/16 2130)  potassium chloride SA (K-DUR,KLOR-CON) CR tablet 40 mEq (40 mEq Oral Given 02/11/16 2058)  0.9 %  sodium chloride infusion ( Intravenous New Bag/Given 02/12/16 0251)  morphine 2 MG/ML injection 2 mg (2 mg Intravenous Given 02/12/16 0021)  sodium chloride 0.9 % 1,000 mL with thiamine 100 mg, folic acid 1 mg, multivitamins adult 10 mL infusion ( Intravenous New Bag/Given 02/12/16 0553)  potassium chloride 10 mEq in 100 mL IVPB (10 mEq Intravenous Given 02/12/16 0811)  potassium chloride SA (K-DUR,KLOR-CON) CR tablet 20 mEq (20 mEq Oral Given 02/12/16 0455)     Initial Impression / Assessment and Plan / ED Course  I have reviewed the triage vital signs and the nursing notes.  Pertinent labs & imaging results that were available during my care of the patient were reviewed by me and considered in my medical decision  making (see chart for details).  Clinical Course  Patient has significant low potassium will  therefore be admitted for potassium and magnesium replacement.    Final Clinical Impressions(s) / ED Diagnoses   Final diagnoses:  Tachycardia  Hypokalemia  Alcohol abuse  Hypomagnesemia    New Prescriptions Discharge Medication List as of 02/13/2016  1:13 PM    I personally performed the services described in this documentation, which was scribed in my presence. The recorded information has been reviewed and considered.    Nelva Nay, MD 03/10/16 [phone]

## 2016-02-12 ENCOUNTER — Encounter (HOSPITAL_COMMUNITY): Payer: Self-pay | Admitting: Internal Medicine

## 2016-02-12 DIAGNOSIS — F10239 Alcohol dependence with withdrawal, unspecified: Secondary | ICD-10-CM | POA: Diagnosis present

## 2016-02-12 DIAGNOSIS — R7989 Other specified abnormal findings of blood chemistry: Secondary | ICD-10-CM | POA: Diagnosis present

## 2016-02-12 DIAGNOSIS — E876 Hypokalemia: Secondary | ICD-10-CM

## 2016-02-12 DIAGNOSIS — F10939 Alcohol use, unspecified with withdrawal, unspecified: Secondary | ICD-10-CM | POA: Diagnosis present

## 2016-02-12 LAB — BASIC METABOLIC PANEL
ANION GAP: 11 (ref 5–15)
BUN: <5 mg/dL — ABNORMAL LOW (ref 6–20)
CALCIUM: 8.4 mg/dL — AB (ref 8.9–10.3)
CHLORIDE: 102 mmol/L (ref 101–111)
CO2: 24 mmol/L (ref 22–32)
Creatinine, Ser: 0.62 mg/dL (ref 0.61–1.24)
GFR calc non Af Amer: >60 mL/min (ref >60)
Glucose, Bld: 126 mg/dL — ABNORMAL HIGH (ref 65–99)
Potassium: 3.9 mmol/L (ref 3.5–5.1)
SODIUM: 137 mmol/L (ref 135–145)

## 2016-02-12 LAB — CBC
HCT: 32.1 % — ABNORMAL LOW (ref 39.0–52.0)
HEMOGLOBIN: 10.9 g/dL — AB (ref 13.0–17.0)
MCH: 37.2 pg — AB (ref 26.0–34.0)
MCHC: 34 g/dL (ref 30.0–36.0)
MCV: 109.6 fL — ABNORMAL HIGH (ref 78.0–100.0)
PLATELETS: 392 10*3/uL (ref 150–400)
RBC: 2.93 MIL/uL — AB (ref 4.22–5.81)
RDW: 16.1 % — ABNORMAL HIGH (ref 11.5–15.5)
WBC: 14 10*3/uL — AB (ref 4.0–10.5)

## 2016-02-12 LAB — ETHANOL

## 2016-02-12 LAB — MAGNESIUM: MAGNESIUM: 1.7 mg/dL (ref 1.7–2.4)

## 2016-02-12 LAB — FOLATE: Folate: 53.1 ng/mL (ref >5.9)

## 2016-02-12 LAB — VITAMIN B12: VITAMIN B 12: 412 pg/mL (ref 180–914)

## 2016-02-12 MED ORDER — KETOROLAC TROMETHAMINE 30 MG/ML IJ SOLN
30.0000 mg | Freq: Four times a day (QID) | INTRAMUSCULAR | Status: DC | PRN
  Administered 2016-02-12 (×2): 30 mg via INTRAVENOUS
  Filled 2016-02-12 (×2): qty 1

## 2016-02-12 MED ORDER — BOOST / RESOURCE BREEZE PO LIQD
1.0000 | Freq: Three times a day (TID) | ORAL | Status: DC
  Administered 2016-02-12 – 2016-02-13 (×4): 1 via ORAL

## 2016-02-12 MED ORDER — ONDANSETRON HCL 4 MG PO TABS: 4.0000 mg | ORAL_TABLET | Freq: Four times a day (QID) | ORAL | Status: DC | PRN

## 2016-02-12 MED ORDER — ACETAMINOPHEN 650 MG RE SUPP: 650.0000 mg | Freq: Four times a day (QID) | RECTAL | Status: DC | PRN

## 2016-02-12 MED ORDER — POTASSIUM CHLORIDE 10 MEQ/100ML IV SOLN
10.0000 meq | INTRAVENOUS | Status: AC
  Administered 2016-02-12 (×4): 10 meq via INTRAVENOUS
  Filled 2016-02-12 (×4): qty 100

## 2016-02-12 MED ORDER — THIAMINE HCL 100 MG/ML IJ SOLN: 100.0000 mg | Freq: Every day | INTRAMUSCULAR | Status: DC

## 2016-02-12 MED ORDER — LORAZEPAM 2 MG/ML IJ SOLN
1.0000 mg | Freq: Four times a day (QID) | INTRAMUSCULAR | Status: DC | PRN
  Administered 2016-02-12: 1 mg via INTRAVENOUS
  Filled 2016-02-12: qty 1

## 2016-02-12 MED ORDER — SODIUM CHLORIDE 0.9 % IV SOLN
INTRAVENOUS | Status: AC
  Administered 2016-02-12: 03:00:00 via INTRAVENOUS

## 2016-02-12 MED ORDER — ACETAMINOPHEN 325 MG PO TABS: 650.0000 mg | ORAL_TABLET | Freq: Four times a day (QID) | ORAL | Status: DC | PRN

## 2016-02-12 MED ORDER — DEXMEDETOMIDINE HCL IN NACL 200 MCG/50ML IV SOLN: 0.2000 ug/kg/h | INTRAVENOUS | Status: DC

## 2016-02-12 MED ORDER — ADULT MULTIVITAMIN W/MINERALS CH: 1.0000 | ORAL_TABLET | Freq: Every day | ORAL | Status: DC

## 2016-02-12 MED ORDER — LORAZEPAM 1 MG PO TABS
1.0000 mg | ORAL_TABLET | Freq: Four times a day (QID) | ORAL | Status: DC | PRN
  Administered 2016-02-13: 1 mg via ORAL
  Filled 2016-02-12: qty 1

## 2016-02-12 MED ORDER — CALCIUM CARBONATE 1250 (500 CA) MG PO TABS
1.0000 | ORAL_TABLET | Freq: Two times a day (BID) | ORAL | Status: DC
  Administered 2016-02-12 – 2016-02-13 (×3): 500 mg via ORAL
  Filled 2016-02-12 (×3): qty 1

## 2016-02-12 MED ORDER — FOLIC ACID 1 MG PO TABS: 1.0000 mg | ORAL_TABLET | Freq: Every day | ORAL | Status: DC

## 2016-02-12 MED ORDER — GABAPENTIN 400 MG PO CAPS
400.0000 mg | ORAL_CAPSULE | Freq: Three times a day (TID) | ORAL | Status: DC
  Administered 2016-02-12 – 2016-02-13 (×5): 400 mg via ORAL
  Filled 2016-02-12 (×5): qty 1

## 2016-02-12 MED ORDER — POTASSIUM CHLORIDE CRYS ER 20 MEQ PO TBCR
20.0000 meq | EXTENDED_RELEASE_TABLET | Freq: Once | ORAL | Status: AC
  Administered 2016-02-12: 20 meq via ORAL
  Filled 2016-02-12: qty 1

## 2016-02-12 MED ORDER — ENOXAPARIN SODIUM 40 MG/0.4ML ~~LOC~~ SOLN
40.0000 mg | SUBCUTANEOUS | Status: DC
  Filled 2016-02-12 (×2): qty 0.4

## 2016-02-12 MED ORDER — LORAZEPAM 2 MG/ML IJ SOLN: 1.0000 mg | INTRAMUSCULAR | Status: DC | PRN

## 2016-02-12 MED ORDER — THIAMINE HCL 100 MG/ML IJ SOLN
Freq: Once | INTRAVENOUS | Status: AC
  Administered 2016-02-12: 06:00:00 via INTRAVENOUS
  Filled 2016-02-12: qty 1000

## 2016-02-12 MED ORDER — FOLIC ACID 1 MG PO TABS
1.0000 mg | ORAL_TABLET | Freq: Every day | ORAL | Status: DC
  Administered 2016-02-12 – 2016-02-13 (×2): 1 mg via ORAL
  Filled 2016-02-12 (×2): qty 1

## 2016-02-12 MED ORDER — ONDANSETRON HCL 4 MG/2ML IJ SOLN: 4.0000 mg | Freq: Four times a day (QID) | INTRAMUSCULAR | Status: DC | PRN

## 2016-02-12 MED ORDER — ONDANSETRON HCL 4 MG/2ML IJ SOLN: 4.0000 mg | Freq: Three times a day (TID) | INTRAMUSCULAR | Status: DC | PRN

## 2016-02-12 MED ORDER — ADULT MULTIVITAMIN W/MINERALS CH
1.0000 | ORAL_TABLET | Freq: Every day | ORAL | Status: DC
  Administered 2016-02-12 – 2016-02-13 (×2): 1 via ORAL
  Filled 2016-02-12 (×2): qty 1

## 2016-02-12 MED ORDER — SODIUM CHLORIDE 0.9% FLUSH
3.0000 mL | Freq: Two times a day (BID) | INTRAVENOUS | Status: DC
  Administered 2016-02-12 (×2): 3 mL via INTRAVENOUS

## 2016-02-12 MED ORDER — VITAMIN B-1 100 MG PO TABS: 100.0000 mg | ORAL_TABLET | Freq: Every day | ORAL | Status: DC

## 2016-02-12 MED ORDER — AMITRIPTYLINE HCL 50 MG PO TABS
25.0000 mg | ORAL_TABLET | Freq: Every day | ORAL | Status: DC
  Administered 2016-02-12: 25 mg via ORAL
  Filled 2016-02-12: qty 1

## 2016-02-12 MED ORDER — LACTULOSE 10 GM/15ML PO SOLN
30.0000 g | Freq: Three times a day (TID) | ORAL | Status: DC
  Filled 2016-02-12 (×4): qty 45

## 2016-02-12 MED ORDER — METOPROLOL TARTRATE 25 MG PO TABS
25.0000 mg | ORAL_TABLET | Freq: Two times a day (BID) | ORAL | Status: DC
  Administered 2016-02-12 – 2016-02-13 (×3): 25 mg via ORAL
  Filled 2016-02-12 (×3): qty 1

## 2016-02-12 MED ORDER — VITAMIN B-1 100 MG PO TABS
100.0000 mg | ORAL_TABLET | Freq: Every day | ORAL | Status: DC
  Administered 2016-02-12 – 2016-02-13 (×2): 100 mg via ORAL
  Filled 2016-02-12 (×2): qty 1

## 2016-02-12 NOTE — H&P (Signed)
History and Physical    Albert Lara ZOX:[id number] DOB: Mar 11, 1976 DOA: 02/11/2016  PCP: No primary care provider on file. Consultants:  None Patient coming from: home - lives with cousin; [place]: cousin, [place], [phone]  Chief Complaint: leg cramps  HPI: Albert Lara is a 40 y.o. male with medical history significant of recent hospitalization from 8/3-10/17 for acute hypokalemia, hypomagnesemia and various other electrolyte abnormalities thought to be related to chronic ETOH dependence.  He was discharged home with improvement but returned to drinking and presents again with the same issues.  Patient reports that he was having pain in lower legs and feet.  Has been happening on and off for a couple of weeks.  Uncertain why his K+ is so low.  No n/v/d.  No diuretics.  Denies s/sx of infection.  Does have tingling toes and fingers when he has cramps.    Drinks about a quart of vodka per day.  Started drinking in his youth.  Last time not dirinking was during prior hospitalization - but received Ativan.  Has had shakes from not drinking - in his hands, starting a few days after drinking.  No seizures.  Wants to stop drinking.  Would like to do an inpatient program for detox.  Last drink was 24 hours ago  Not feeling shaky now.   Blurry vision for 2 months or so.   ED Course: Again with profound hypokalemia - given 40 mEq PO KCl and 10 mEq IV.  Morphine for pain.  Called for admission.  Review of Systems: As per HPI; otherwise 10 point review of systems reviewed and negative.   Ambulatory Status:  Ambulates without assistance  Past Medical History:  Diagnosis Date  . ETOH abuse   . Hypokalemia   . Peripheral neuropathy [facility])     Past Surgical History:  Procedure Laterality Date  . TONSILLECTOMY      Social History   Social History  . Marital status: Single    Spouse name: N/A  . Number of children: N/A  . Years of education: N/A   Occupational History    . 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 Smoking status: Current Every Day Smoker    Packs/day: 1.00    Years: 20.00    Types: Cigarettes  . Smokeless tobacco: Never Used  . Alcohol use Yes     Comment: quart of liquor daily  . Drug use: No  . Sexual activity: Not on file   Other Topics Concern  . Not on file   Social History Narrative  . No narrative on file    No Known Allergies  Family History  Problem Relation Age of Onset  . Diabetes Mother     Prior to Admission medications   Medication Sig Start Date End Date Taking? Authorizing Provider  amitriptyline (ELAVIL) 25 MG tablet Take 1 tablet (25 mg total) by mouth at bedtime. 01/31/16   Belkys A Regalado, MD  calcium carbonate (OS-CAL - DOSED IN MG OF ELEMENTAL CALCIUM) 1250 (500 Ca) MG tablet Take 1 tablet (500 mg of elemental calcium total) by mouth 2 (two) times daily with a meal. 01/31/16   Belkys A Regalado, MD  ciprofloxacin (CILOXAN) 0.3 % ophthalmic solution Place 1 drop into both eyes every 4 (four) hours while awake. Administer 1 drop, every 2 hours, while awake, for 2 days. Then 1 drop, every 4 hours, while awake, for the next 5 days. 01/31/16  Belkys A Regalado, MD  feeding supplement (BOOST / RESOURCE BREEZE) LIQD Take 1 Container by mouth 3 (three) times daily between meals. 01/31/16   Belkys A Regalado, MD  folic acid (FOLVITE) 1 MG tablet Take 1 tablet (1 mg total) by mouth daily. 01/31/16   Belkys A Regalado, MD  gabapentin (NEURONTIN) 400 MG capsule Take 1 capsule (400 mg total) by mouth 3 (three) times daily. 01/31/16   Belkys A Regalado, MD  lactulose (CHRONULAC) 10 GM/15ML solution Take 45 mLs (30 g total) by mouth 3 (three) times daily. 02/02/16   Haydee SalterPhillip M Hobbs, MD  magnesium oxide (MAG-OX) 400 (241.3 Mg) MG tablet Take 1 tablet (400 mg total) by mouth 2 (two) times daily. 01/31/16   Belkys A Regalado, MD  metoprolol tartrate (LOPRESSOR) 25 MG tablet Take 1 tablet (25 mg total) by  mouth 2 (two) times daily. 01/31/16   Belkys A Regalado, MD  Multiple Vitamin (MULTIVITAMIN WITH MINERALS) TABS tablet Take 1 tablet by mouth daily. 01/31/16   Belkys A Regalado, MD  thiamine 100 MG tablet Take 1 tablet (100 mg total) by mouth daily. 01/31/16   Belkys A Regalado, MD  traMADol (ULTRAM) 50 MG tablet Take 1 tablet (50 mg total) by mouth every 6 (six) hours as needed for moderate pain. 01/31/16   Belkys A Regalado, MD  vitamin B-12 1000 MCG tablet Take 1 tablet (1,000 mcg total) by mouth daily. 01/31/16   Belkys A Regalado, MD  Vitamin D, Ergocalciferol, (DRISDOL) 50000 units CAPS capsule Take 1 capsule (50,000 Units total) by mouth every 7 (seven) days. 01/31/16   Alba CoryBelkys A Regalado, MD    Physical Exam: Vitals:   02/12/16 0115 02/12/16 0130 02/12/16 0145 02/12/16 0242  BP: (!) 140/104 (!) 137/103 146/100 (!) 152/102  Pulse: 119 (!) 121 (!) 128 (!) 122  Resp:    16  Temp:    98.9 F (37.2 C)  TempSrc:    Oral  SpO2: 98% 95% 99% 100%  Height:         General:  Appears calm and comfortable and is NAD; mildly anxious, fidgety  Eyes:   EOMI, normal lids, iris ENT:  grossly normal hearing, lips & tongue, mmm Neck:  no LAD, masses or thyromegaly Cardiovascular:  tachycardia, no m/r/g. No LE edema.  Respiratory:  CTA bilaterally, no w/r/r. Normal respiratory effort. Abdomen:  soft, ntnd, NABS Skin:  no rash or induration seen on limited exam Musculoskeletal:  grossly normal tone BUE/BLE, good ROM, no bony abnormality; foot hyperextension R>L which appears to be resulting from cramping Psychiatric:  Mildly anxious and fidgety, speech fluent and appropriate, AOx3 Neurologic:  CN 2-12 grossly intact, moves all extremities in coordinated fashion, sensation intact  Labs on Admission: I have personally reviewed following labs and imaging studies  CBC:  Recent Labs Lab 02/11/16 1950  WBC 15.3*  NEUTROABS 10.6*  HGB 12.3*  HCT 33.7*  MCV 103.7*  PLT 468*   Basic Metabolic  Panel:  Recent Labs Lab 02/11/16 1950  NA 133*  K 2.6*  CL 95*  CO2 24  GLUCOSE 113*  BUN 3*  CREATININE 0.51*  CALCIUM 8.5*  MG 1.3*   GFR: Estimated Creatinine Clearance: 119.2 mL/min (by C-G formula based on SCr of 0.8 mg/dL). Liver Function Tests:  Recent Labs Lab 02/11/16 1950  AST 108*  ALT 52  ALKPHOS 191*  BILITOT 0.7  PROT 7.0  ALBUMIN 3.3*   No results for input(s): LIPASE, AMYLASE in the last 168  hours. No results for input(s): AMMONIA in the last 168 hours. Coagulation Profile: No results for input(s): INR, PROTIME in the last 168 hours. Cardiac Enzymes: No results for input(s): CKTOTAL, CKMB, CKMBINDEX, TROPONINI in the last 168 hours. BNP (last 3 results) No results for input(s): PROBNP in the last 8760 hours. HbA1C: No results for input(s): HGBA1C in the last 72 hours. CBG: No results for input(s): GLUCAP in the last 168 hours. Lipid Profile: No results for input(s): CHOL, HDL, LDLCALC, TRIG, CHOLHDL, LDLDIRECT in the last 72 hours. Thyroid Function Tests: No results for input(s): TSH, T4TOTAL, FREET4, T3FREE, THYROIDAB in the last 72 hours. Anemia Panel: No results for input(s): VITAMINB12, FOLATE, FERRITIN, TIBC, IRON, RETICCTPCT in the last 72 hours. Urine analysis:    Component Value Date/Time   COLORURINE YELLOW 02/11/2016 2153   APPEARANCEUR CLEAR 02/11/2016 2153   LABSPEC 1.010 02/11/2016 2153   PHURINE 6.5 02/11/2016 2153   GLUCOSEU NEGATIVE 02/11/2016 2153   HGBUR NEGATIVE 02/11/2016 2153   BILIRUBINUR NEGATIVE 02/11/2016 2153   KETONESUR NEGATIVE 02/11/2016 2153   PROTEINUR NEGATIVE 02/11/2016 2153   NITRITE NEGATIVE 02/11/2016 2153   LEUKOCYTESUR NEGATIVE 02/11/2016 2153    Creatinine Clearance: Estimated Creatinine Clearance: 119.2 mL/min (by C-G formula based on SCr of 0.8 mg/dL).  Sepsis Labs: @LABRCNTIP (procalcitonin:4,lacticidven:4) )No results found for this or any previous visit (from the past 240 hour(s)).    Radiological Exams on Admission: Dg Chest Portable 1 View  Result Date: 02/11/2016 CLINICAL DATA:  Fever EXAM: PORTABLE CHEST 1 VIEW COMPARISON:  None. FINDINGS: Normal heart size and mediastinal contours. No acute infiltrate or edema. No effusion or pneumothorax. No acute osseous findings. IMPRESSION: No active disease. Electronically Signed   By: Marnee SpringJonathon  Watts M.D.   On: 02/11/2016 22:46    EKG: Independently reviewed.  Sinus tachycardia with rate 120; NSCSLT  Assessment/Plan Principal Problem:   Hypokalemia Active Problems:   Hypomagnesemia   Alcohol use disorder, severe, dependence (HCC)   Peripheral neuropathy (HCC)   Malnutrition of moderate degree   High serum lactate   Alcohol withdrawal (HCC)   Hypokalemia/hypomagnesemia -Patient again presenting with severe hypokalemia -Marked cramping of legs and feet -[facility] 40 [facility]Eq PO KCl [facility] -Based on usual correction factor, this would be expected to increase K+ to 3.1 -Will give an additional 20 mEq PO KCl and 40 mEq IV at this time, with the expectation that this will bring his K+ to approximately 3.7 -Will treat pain with Toradol - normal kidney function, no/less abuse potential than narcotics -Magnesium also low and already repleted with 2 grams of IV Mag -Will recheck Mag level in AM, as K+ is unlikely to be corrected so long as Mag remains low  Severe ETOH Dependence -Patient began drinking again after leaving hospital last time -Expresses desire for inpatient treatment, which would be a very good option for him -SW consult requested -Patient appears to already be in mild withdrawal - has tachycardia, mildly agitated -Will initiate CIWA protocol now -High serum lactate appears to be related to this problem rather than to sepsis; he has negative UA and CXR, denies s/sx of infection.  He does have a mildly elevated WBC but his entire CBC appears to be hemoconcentrated so will follow.  No indication for  antibiotics at this time.  Will trend lactate. -Banana bag ordered and then thiamine and folate with MVI daily -Will check ETOH level and UDS  Peripheral neuropathy -Continue gabapentin  Malnutrition -Continue Boost/Breeze  DVT prophylaxis: Lovenox  Code Status: Full - confirmed with patient  Family Communication: None present  Disposition Plan:  Home once clinically improved Consults called: SW for inpatient rehab  Admission status: Admit to telemetry    Jonah Blue MD [facility]  If 7PM-7AM, please contact night-coverage [email or web address] Password [facility]  02/12/2016, 3:49 AM

## 2016-02-12 NOTE — ED Notes (Signed)
Pt prefers sitting up in chair, HR increased to 144, steady in chair, NAD, calm, "feel better", pending arrival of [facility].

## 2016-02-12 NOTE — Progress Notes (Signed)
PROGRESS NOTE    Albert Lara  ION:[id number] DOB: 1976-01-31 DOA: 02/11/2016 PCP: No primary care provider on file.   Brief Narrative:  Albert Lara is a 40 year old gentleman with a past medical history of alcoholism, admitted to medicine service on 02/12/2016 when he presented with bilateral lower extremity leg cramps, generalized weakness, with lab work showing potassium of 2.6. He reports having little by mouth intake over the last several weeks and has been drinking about a quart of vodka a day. Overnight he was certain IV fluids with replacement of electrolytes.   Assessment & Plan:   Principal Problem:   Hypokalemia Active Problems:   Hypomagnesemia   Alcohol use disorder, severe, dependence (HCC)   Peripheral neuropathy (HCC)   Malnutrition of moderate degree   High serum lactate   Alcohol withdrawal (HCC)  1.  Peripheral neuropathy -Albert Lara presents with complaints of bilateral lower extremity pain, weakness, likely secondary to ongoing alcohol abuse. -Overnight he was started on gabapentin.  -We'll check a B-12 and folate level  2.  Severe hypokalemia. -Lab work showing potassium of 2.6 for which she received IV potassium replacement overnight. -Repeat lab work this morning revealing improvement to potassium levels at 3.9 -Electrolyte abnormalities likely precipitated by his alcohol abuse  3.  Hypomagnesemia -Presenting lab work showing magnesium of 1.3, improving to 1.7 after replacement Again likely related to alcohol abuse  4.  ETOH induced hepatitis -Lab work showing an AST of 108 and ALT of 52. Alk phosphatase was 191.  5.  ETOH Abuse -Reporting drinking 1 quart of vodka daily -Placed on CIWA protocol  DVT prophylaxis: Lovenox Code Status: Full Code Family Communication:  Disposition Plan: Anticipate discharge in the next 24 hours   Objective: Vitals:   02/12/16 0130 02/12/16 0145 02/12/16 0242 02/12/16 1441  BP: (!) 137/103 146/100  (!) 152/102 (!) 132/95  Pulse: (!) 121 (!) 128 (!) 122 93  Resp:   16 19  Temp:   98.9 F (37.2 C)   TempSrc:   Oral   SpO2: 95% 99% 100% 100%  Height:        Intake/Output Summary (Last 24 hours) at 02/12/16 1515 Last data filed at 02/12/16 0939  Gross per 24 hour  Intake          3943.75 ml  Output              325 ml  Net          3618.75 ml   There were no vitals filed for this visit.  Examination:  General exam: NAD, appears sleepy however is arousable Respiratory system: Clear to auscultation. Respiratory effort normal. Cardiovascular system: S1 & S2 heard, RRR. No JVD, murmurs, rubs, gallops or clicks. No pedal edema. Gastrointestinal system: Abdomen is nondistended, soft and nontender. No organomegaly or masses felt. Normal bowel sounds heard. Central nervous system: Alert and oriented. No focal neurological deficits. Extremities: Symmetric 5 x 5 power. Skin: No rashes, lesions or ulcers Psychiatry: Judgement and insight appear normal. Mood & affect appropriate.     Data Reviewed: I have personally reviewed following labs and imaging studies  CBC:  Recent Labs Lab 02/11/16 1950 02/12/16 0425  WBC 15.3* 14.0*  NEUTROABS 10.6*  --   HGB 12.3* 10.9*  HCT 33.7* 32.1*  MCV 103.7* 109.6*  PLT 468* 244   Basic Metabolic Panel:  Recent Labs Lab 02/11/16 1950 02/12/16 0425  NA 133* 137  K 2.6* 3.9  CL 95* 102  CO2 24  24  GLUCOSE 113* 126*  BUN 3* <5*  CREATININE 0.51* 0.62  CALCIUM 8.5* 8.4*  MG 1.3* 1.7   GFR: Estimated Creatinine Clearance: 119.2 mL/min (by C-G formula based on SCr of 0.8 mg/dL). Liver Function Tests:  Recent Labs Lab 02/11/16 1950  AST 108*  ALT 52  ALKPHOS 191*  BILITOT 0.7  PROT 7.0  ALBUMIN 3.3*   No results for input(s): LIPASE, AMYLASE in the last 168 hours. No results for input(s): AMMONIA in the last 168 hours. Coagulation Profile: No results for input(s): INR, PROTIME in the last 168 hours. Cardiac  Enzymes: No results for input(s): CKTOTAL, CKMB, CKMBINDEX, TROPONINI in the last 168 hours. BNP (last 3 results) No results for input(s): PROBNP in the last 8760 hours. HbA1C: No results for input(s): HGBA1C in the last 72 hours. CBG: No results for input(s): GLUCAP in the last 168 hours. Lipid Profile: No results for input(s): CHOL, HDL, LDLCALC, TRIG, CHOLHDL, LDLDIRECT in the last 72 hours. Thyroid Function Tests: No results for input(s): TSH, T4TOTAL, FREET4, T3FREE, THYROIDAB in the last 72 hours. Anemia Panel:  Recent Labs  02/12/16 1235  VITAMINB12 412   Sepsis Labs:  Recent Labs Lab 02/11/16 2211  LATICACIDVEN 2.29*    No results found for this or any previous visit (from the past 240 hour(s)).    Radiology Studies: Dg Chest Portable 1 View  Result Date: 02/11/2016 CLINICAL DATA:  Fever EXAM: PORTABLE CHEST 1 VIEW COMPARISON:  None. FINDINGS: Normal heart size and mediastinal contours. No acute infiltrate or edema. No effusion or pneumothorax. No acute osseous findings. IMPRESSION: No active disease. Electronically Signed   By: Monte Fantasia M.D.   On: 02/11/2016 22:46     Scheduled Meds: . amitriptyline  25 mg Oral QHS  . calcium carbonate  1 tablet Oral BID WC  . enoxaparin (LOVENOX) injection  40 mg Subcutaneous Q24H  . feeding supplement  1 Container Oral TID BM  . folic acid  1 mg Oral Daily  . gabapentin  400 mg Oral TID  . lactulose  30 g Oral TID  . metoprolol tartrate  25 mg Oral BID  . multivitamin with minerals  1 tablet Oral Daily  . sodium chloride flush  3 mL Intravenous Q12H  . thiamine  100 mg Oral Daily   Continuous Infusions:    LOS: 0 days    Time spent: No Charge    Kelvin Cellar, MD [facility] Pager [phone]  If 7PM-7AM, please contact night-coverage [email or web address] Password [email or web address] 02/12/2016, 3:15 PM

## 2016-02-13 DIAGNOSIS — E440 Moderate protein-calorie malnutrition: Secondary | ICD-10-CM

## 2016-02-13 DIAGNOSIS — F1020 Alcohol dependence, uncomplicated: Secondary | ICD-10-CM

## 2016-02-13 DIAGNOSIS — G588 Other specified mononeuropathies: Secondary | ICD-10-CM

## 2016-02-13 LAB — LACTIC ACID, PLASMA: Lactic Acid, Venous: 1.5 mmol/L (ref 0.5–1.9)

## 2016-02-13 NOTE — Progress Notes (Signed)
RN attempted to discharge pt since d/c order placed. Pt verbalized to RN that he is not ready to be discharge until in the AM. MD aware and spoke to pt once again. CSW spoke to pt r/t his disposition and given bus voucher at this time. Charge nurse aware of situation. Pt given information per request on disposition.Pt request times to look over shelters at this time.   Sim BoastHavy, RN

## 2016-02-13 NOTE — Progress Notes (Addendum)
Addendum: CSW provided patient with homeless resources and a bus pass. Then RN alerted CSW that pt had found a ride.  CSW received consult for substance use resources. Patient asleep and would not arouse to voice. CSW left resources at bedside according to earlier conversation with patient. CSW asked RN to call CSW if patient wakes up to answer questions about resources.  CSW signing off.  Albert Lara LCSWA [phone]

## 2016-02-13 NOTE — Discharge Summary (Signed)
Physician Discharge Summary  Albert Lara QIH:[id number] DOB: 13-Nov-1975 DOA: 02/11/2016  PCP: No primary care provider on file.  Admit date: 02/11/2016 Discharge date: 02/13/2016  Time spent: 35 minutes  Recommendations for Outpatient Follow-up:  1. Mr Albert Lara with history of ETOH abuse, admitted for dehydration and electrolyte abnormalities improved with IV fluids  2. On discharge SW consulted provided him with resources    Discharge Diagnoses:  Principal Problem:   Hypokalemia Active Problems:   Hypomagnesemia   Alcohol use disorder, severe, dependence (HCC)   Peripheral neuropathy (HCC)   Malnutrition of moderate degree   High serum lactate   Alcohol withdrawal ([place])   Discharge Condition: Stable  Diet recommendation: Regular Diet  There were no vitals filed for this visit.  History of present illness:  Albert Lara is a 40 y.o. male with medical history significant of recent hospitalization from 8/3-10/17 for acute hypokalemia, hypomagnesemia and various other electrolyte abnormalities thought to be related to chronic ETOH dependence.  He was discharged home with improvement but returned to drinking and presents again with the same issues.  Patient reports that he was having pain in lower legs and feet.  Has been happening on and off for a couple of weeks.  Uncertain why his K+ is so low.  No n/v/d.  No diuretics.  Denies s/sx of infection.  Does have tingling toes and fingers when he has cramps.    Drinks about a quart of vodka per day.  Started drinking in his youth.  Last time not dirinking was during prior hospitalization - but received Ativan.  Has had shakes from not drinking - in his hands, starting a few days after drinking.  No seizures.  Wants to stop drinking.  Would like to do an inpatient program for detox.  Last drink was 24 hours ago  Not feeling shaky now.   Hospital Course:  Mr. Albert Lara is a 40 year old gentleman with a past medical  history of alcoholism, admitted to medicine service on 02/12/2016 when he presented with bilateral lower extremity leg cramps, generalized weakness, with lab work showing potassium of 2.6. He reports having little by mouth intake over the last several weeks and has been drinking about a quart of vodka a day. Overnight he was certain IV fluids with replacement of electrolytes.  1.  Peripheral neuropathy -Mr. Albert Lara presents with complaints of bilateral lower extremity pain, weakness, likely secondary to ongoing alcohol abuse.  -B-12 level = 412  and folate level =53.1  2.  Severe hypokalemia. -Lab work showing potassium of 2.6 for which she received IV potassium replacement overnight. -Repeat lab work this morning revealing improvement to potassium levels at 3.9 -Electrolyte abnormalities likely precipitated by his alcohol abuse  3.  Hypomagnesemia -Presenting lab work showing magnesium of 1.3, improving to 1.7 after replacement Again likely related to alcohol abuse  4.  ETOH induced hepatitis -Lab work showing an AST of 108 and ALT of 52. Alk phosphatase was 191.  5.  ETOH Abuse -Reporting drinking 1 quart of vodka daily -Placed on CIWA protocol -By 02/13/2016 did not have evidence of DT's  -SW was consulted to assist with getting him programs and information for help    Discharge Exam: Vitals:   02/12/16 2357 02/13/16 0627  BP: 120/89 122/85  Pulse: (!) 107 (!) 107  Resp: 16 17  Temp: 98.4 F (36.9 C) 98.5 F (36.9 C)    General: Awake and alert, no acute distress, nontoxic appearing Cardiovascular: RRR nl S1S2 Respiratory: Nl  inspiratory effort Abd: Soft NT ND   Discharge Instructions   Discharge Instructions    Call MD for:    Complete by:  As directed   Call MD for:  difficulty breathing, headache or visual disturbances    Complete by:  As directed   Call MD for:  extreme fatigue    Complete by:  As directed   Call MD for:  hives    Complete by:  As directed    Call MD for:  persistant dizziness or light-headedness    Complete by:  As directed   Call MD for:  persistant nausea and vomiting    Complete by:  As directed   Call MD for:  redness, tenderness, or signs of infection (pain, swelling, redness, odor or green/yellow discharge around incision site)    Complete by:  As directed   Call MD for:  severe uncontrolled pain    Complete by:  As directed   Call MD for:  temperature >100.4    Complete by:  As directed   Diet - low sodium heart healthy    Complete by:  As directed   Increase activity slowly    Complete by:  As directed     Current Discharge Medication List    CONTINUE these medications which have NOT CHANGED   Details  amitriptyline (ELAVIL) 25 MG tablet Take 1 tablet (25 mg total) by mouth at bedtime. Qty: 30 tablet, Refills: 0    calcium carbonate (OS-CAL - DOSED IN MG OF ELEMENTAL CALCIUM) 1250 (500 Ca) MG tablet Take 1 tablet (500 mg of elemental calcium total) by mouth 2 (two) times daily with a meal. Qty: 60 tablet, Refills: 0    feeding supplement (BOOST / RESOURCE BREEZE) LIQD Take 1 Container by mouth 3 (three) times daily between meals. Qty: 30 Container, Refills: 0    folic acid (FOLVITE) 1 MG tablet Take 1 tablet (1 mg total) by mouth daily. Qty: 30 tablet, Refills: 0    gabapentin (NEURONTIN) 400 MG capsule Take 1 capsule (400 mg total) by mouth 3 (three) times daily. Qty: 90 capsule, Refills: 0    lactulose (CHRONULAC) 10 GM/15ML solution Take 45 mLs (30 g total) by mouth 3 (three) times daily. Qty: 240 mL, Refills: 0    magnesium oxide (MAG-OX) 400 (241.3 Mg) MG tablet Take 1 tablet (400 mg total) by mouth 2 (two) times daily. Qty: 20 tablet, Refills: 0    metoprolol tartrate (LOPRESSOR) 25 MG tablet Take 1 tablet (25 mg total) by mouth 2 (two) times daily. Qty: 60 tablet, Refills: 0    Multiple Vitamin (MULTIVITAMIN WITH MINERALS) TABS tablet Take 1 tablet by mouth daily. Qty: 30 tablet, Refills: 0     thiamine 100 MG tablet Take 1 tablet (100 mg total) by mouth daily. Qty: 30 tablet, Refills: 0    traMADol (ULTRAM) 50 MG tablet Take 1 tablet (50 mg total) by mouth every 6 (six) hours as needed for moderate pain. Qty: 20 tablet, Refills: 0    vitamin B-12 1000 MCG tablet Take 1 tablet (1,000 mcg total) by mouth daily. Qty: 5 tablet, Refills: 0    Vitamin D, Ergocalciferol, (DRISDOL) 50000 units CAPS capsule Take 1 capsule (50,000 Units total) by mouth every 7 (seven) days. Qty: 4 capsule, Refills: 0       No Known Allergies    The results of significant diagnostics from this hospitalization (including imaging, microbiology, ancillary and laboratory) are listed below for reference.  Significant Diagnostic Studies: Mr Brain 36 Contrast  Result Date: 02/01/2016 CLINICAL DATA:  Initial evaluation for possible ward with the encephalopathy. Heavy history of dog off all abuse. EXAM: MRI HEAD WITHOUT CONTRAST TECHNIQUE: Multiplanar, multiecho pulse sequences of the brain and surrounding structures were obtained without intravenous contrast. COMPARISON:  None. FINDINGS: Study mildly degraded by motion artifact. Advanced cerebral atrophy for patient age. No significant cerebral white matter disease present. No abnormal foci of restricted diffusion to suggest acute ischemia. Gray-white matter differentiation maintained. Major intracranial vascular flow voids are preserved. No areas of chronic infarction. No acute or chronic intracranial hemorrhage. No mass lesion, midline shift, or mass effect. No hydrocephalus. No extra-axial fluid collection. Major dural sinuses are grossly patent. No signal changes to suggest acute Wernicke's encephalopathy identified. Periaqueductal gray matter relatively normal. Mammillary bodies symmetric and within normal limits for size without significant swelling. Craniocervical junction within normal limits. Visualized upper cervical spine unremarkable. Pituitary gland  within normal limits. No acute abnormality about the globes and orbits. Mild scattered mucosal thickening within the ethmoidal air cells and maxillary sinuses. Paranasal sinuses are otherwise clear. No significant mastoid effusion. Inner ear structures grossly normal. Bone marrow signal intensity grossly normal. No scalp soft tissue abnormality. IMPRESSION: 1. No acute intracranial process identified. No signal changes to suggest acute Wernicke's encephalopathy identified. 2. Advanced cerebral atrophy for patient age. Electronically Signed   By: Jeannine Boga M.D.   On: 02/01/2016 06:48   Dg Chest Portable 1 View  Result Date: 02/11/2016 CLINICAL DATA:  Fever EXAM: PORTABLE CHEST 1 VIEW COMPARISON:  None. FINDINGS: Normal heart size and mediastinal contours. No acute infiltrate or edema. No effusion or pneumothorax. No acute osseous findings. IMPRESSION: No active disease. Electronically Signed   By: Monte Fantasia M.D.   On: 02/11/2016 22:46   US Abdomen Limited Ruq  Result Date: 01/28/2016 CLINICAL DATA:  40 year old male EXAM: US ABDOMEN LIMITED - RIGHT UPPER QUADRANT COMPARISON:  None. FINDINGS: Gallbladder: Lumen of the gallbladder is approximately 50% occupied with reflective microlithiasis/ sludge at the dependent aspects. Mild wall thickening with trace pericholecystic fluid. Negative sonographic Murphy's sign. Common bile duct: Diameter: 3 mm-4 mm Liver: Enlarged cranial caudal span of the liver with relatively heterogeneous echotexture. IMPRESSION: Cholelithiasis with approximately 50% of the lumen of the gallbladder replaced with microlithiasis/sludge. No sonographic evidence of acute cholecystitis. If there is ongoing concern for acute cholecystitis, correlation with lab values, presentation, and HIDA study is recommended. Hepatomegaly Signed, Dulcy Fanny. Earleen Newport, DO [facility] Radiology Electronically Signed   By: Corrie Mckusick D.O.   On:  01/28/2016 10:22    Microbiology: No results found for this or any previous visit (from the past 240 hour(s)).   Labs: Basic Metabolic Panel:  Recent Labs Lab 02/11/16 1950 02/12/16 0425  NA 133* 137  K 2.6* 3.9  CL 95* 102  CO2 24 24  GLUCOSE 113* 126*  BUN 3* <5*  CREATININE 0.51* 0.62  CALCIUM 8.5* 8.4*  MG 1.3* 1.7   Liver Function Tests:  Recent Labs Lab 02/11/16 1950  AST 108*  ALT 52  ALKPHOS 191*  BILITOT 0.7  PROT 7.0  ALBUMIN 3.3*   No results for input(s): LIPASE, AMYLASE in the last 168 hours. No results for input(s): AMMONIA in the last 168 hours. CBC:  Recent Labs Lab 02/11/16 1950 02/12/16 0425  WBC 15.3* 14.0*  NEUTROABS 10.6*  --   HGB 12.3* 10.9*  HCT 33.7* 32.1*  MCV 103.7*  109.6*  PLT 468* 392   Cardiac Enzymes: No results for input(s): CKTOTAL, CKMB, CKMBINDEX, TROPONINI in the last 168 hours. BNP: BNP (last 3 results)  Recent Labs  01/26/16 2055  BNP 51.3    ProBNP (last 3 results) No results for input(s): PROBNP in the last 8760 hours.  CBG: No results for input(s): GLUCAP in the last 168 hours.     Signed:  Kelvin Cellar MD.  [facility] 02/13/2016, 12:43 PM

## 2016-02-15 ENCOUNTER — Encounter (HOSPITAL_BASED_OUTPATIENT_CLINIC_OR_DEPARTMENT_OTHER): Payer: Self-pay | Admitting: *Deleted

## 2016-02-15 ENCOUNTER — Inpatient Hospital Stay (HOSPITAL_BASED_OUTPATIENT_CLINIC_OR_DEPARTMENT_OTHER)
Admission: EM | Admit: 2016-02-15 | Discharge: 2016-02-19 | DRG: 897 | Disposition: A | Discharge status: Home / Self Care | Payer: Self-pay | Attending: Internal Medicine | Admitting: Internal Medicine

## 2016-02-15 DIAGNOSIS — I10 Essential (primary) hypertension: Secondary | ICD-10-CM | POA: Diagnosis present

## 2016-02-15 DIAGNOSIS — F17210 Nicotine dependence, cigarettes, uncomplicated: Secondary | ICD-10-CM | POA: Diagnosis present

## 2016-02-15 DIAGNOSIS — D539 Nutritional anemia, unspecified: Secondary | ICD-10-CM | POA: Diagnosis present

## 2016-02-15 DIAGNOSIS — Z791 Long term (current) use of non-steroidal anti-inflammatories (NSAID): Secondary | ICD-10-CM

## 2016-02-15 DIAGNOSIS — Z6822 Body mass index (BMI) 22.0-22.9, adult: Secondary | ICD-10-CM

## 2016-02-15 DIAGNOSIS — E876 Hypokalemia: Secondary | ICD-10-CM | POA: Diagnosis present

## 2016-02-15 DIAGNOSIS — K219 Gastro-esophageal reflux disease without esophagitis: Secondary | ICD-10-CM | POA: Diagnosis present

## 2016-02-15 DIAGNOSIS — E7220 Disorder of urea cycle metabolism, unspecified: Secondary | ICD-10-CM | POA: Diagnosis present

## 2016-02-15 DIAGNOSIS — F10231 Alcohol dependence with withdrawal delirium: Principal | ICD-10-CM | POA: Diagnosis present

## 2016-02-15 DIAGNOSIS — G629 Polyneuropathy, unspecified: Secondary | ICD-10-CM

## 2016-02-15 DIAGNOSIS — F10939 Alcohol use, unspecified with withdrawal, unspecified: Secondary | ICD-10-CM | POA: Diagnosis present

## 2016-02-15 DIAGNOSIS — F10239 Alcohol dependence with withdrawal, unspecified: Secondary | ICD-10-CM | POA: Diagnosis present

## 2016-02-15 DIAGNOSIS — F05 Delirium due to known physiological condition: Secondary | ICD-10-CM

## 2016-02-15 DIAGNOSIS — D72829 Elevated white blood cell count, unspecified: Secondary | ICD-10-CM | POA: Diagnosis present

## 2016-02-15 HISTORY — DX: Gastro-esophageal reflux disease without esophagitis: K21.9

## 2016-02-15 LAB — COMPREHENSIVE METABOLIC PANEL
ALBUMIN: 2.9 g/dL — AB (ref 3.5–5.0)
ALT: 34 U/L (ref 17–63)
AST: 58 U/L — AB (ref 15–41)
Alkaline Phosphatase: 143 U/L — ABNORMAL HIGH (ref 38–126)
Anion gap: 5 (ref 5–15)
BUN: 5 mg/dL — AB (ref 6–20)
CHLORIDE: 104 mmol/L (ref 101–111)
CO2: 26 mmol/L (ref 22–32)
CREATININE: 0.65 mg/dL (ref 0.61–1.24)
Calcium: 8.4 mg/dL — ABNORMAL LOW (ref 8.9–10.3)
GFR calc Af Amer: >60 mL/min (ref >60)
GLUCOSE: 117 mg/dL — AB (ref 65–99)
POTASSIUM: 4.1 mmol/L (ref 3.5–5.1)
SODIUM: 135 mmol/L (ref 135–145)
Total Bilirubin: 0.7 mg/dL (ref 0.3–1.2)
Total Protein: 6.1 g/dL — ABNORMAL LOW (ref 6.5–8.1)

## 2016-02-15 LAB — CK: Total CK: 51 U/L (ref 49–397)

## 2016-02-15 LAB — URINALYSIS, ROUTINE W REFLEX MICROSCOPIC
Bilirubin Urine: NEGATIVE
GLUCOSE, UA: NEGATIVE mg/dL
HGB URINE DIPSTICK: NEGATIVE
Ketones, ur: NEGATIVE mg/dL
LEUKOCYTES UA: NEGATIVE
Nitrite: NEGATIVE
PH: 7.5 (ref 5.0–8.0)
PROTEIN: NEGATIVE mg/dL
SPECIFIC GRAVITY, URINE: 1.007 (ref 1.005–1.030)

## 2016-02-15 LAB — BASIC METABOLIC PANEL
Anion gap: 7 (ref 5–15)
BUN: 9 mg/dL (ref 6–20)
CALCIUM: 8.7 mg/dL — AB (ref 8.9–10.3)
CO2: 23 mmol/L (ref 22–32)
CREATININE: 0.58 mg/dL — AB (ref 0.61–1.24)
Chloride: 105 mmol/L (ref 101–111)
GFR calc Af Amer: >60 mL/min (ref >60)
GFR calc non Af Amer: >60 mL/min (ref >60)
GLUCOSE: 101 mg/dL — AB (ref 65–99)
Potassium: 4.1 mmol/L (ref 3.5–5.1)
Sodium: 135 mmol/L (ref 135–145)

## 2016-02-15 LAB — PROTIME-INR
INR: 1.32
Prothrombin Time: 16.4 seconds — ABNORMAL HIGH (ref 11.4–15.2)

## 2016-02-15 LAB — TROPONIN I: Troponin I: <0.03 ng/mL (ref <0.03)

## 2016-02-15 LAB — CBC WITH DIFFERENTIAL/PLATELET
Basophils Absolute: 0.1 10*3/uL (ref 0.0–0.1)
Basophils Relative: 1 %
Eosinophils Absolute: 0.3 10*3/uL (ref 0.0–0.7)
Eosinophils Relative: 2 %
HEMATOCRIT: 33.1 % — AB (ref 39.0–52.0)
Hemoglobin: 11.6 g/dL — ABNORMAL LOW (ref 13.0–17.0)
LYMPHS ABS: 2.7 10*3/uL (ref 0.7–4.0)
LYMPHS PCT: 17 %
MCH: 37.7 pg — AB (ref 26.0–34.0)
MCHC: 35 g/dL (ref 30.0–36.0)
MCV: 107.5 fL — AB (ref 78.0–100.0)
MONO ABS: 1 10*3/uL (ref 0.1–1.0)
MONOS PCT: 6 %
NEUTROS ABS: 12.1 10*3/uL — AB (ref 1.7–7.7)
Neutrophils Relative %: 74 %
Platelets: 269 10*3/uL (ref 150–400)
RBC: 3.08 MIL/uL — ABNORMAL LOW (ref 4.22–5.81)
RDW: 15 % (ref 11.5–15.5)
WBC: 16.2 10*3/uL — ABNORMAL HIGH (ref 4.0–10.5)

## 2016-02-15 LAB — MAGNESIUM: Magnesium: 1.3 mg/dL — ABNORMAL LOW (ref 1.7–2.4)

## 2016-02-15 LAB — RAPID URINE DRUG SCREEN, HOSP PERFORMED
AMPHETAMINES: NOT DETECTED
BARBITURATES: NOT DETECTED
Benzodiazepines: POSITIVE — AB
COCAINE: NOT DETECTED
OPIATES: NOT DETECTED
TETRAHYDROCANNABINOL: NOT DETECTED

## 2016-02-15 LAB — AMMONIA: AMMONIA: 44 umol/L — AB (ref 9–35)

## 2016-02-15 MED ORDER — METOPROLOL TARTRATE 25 MG PO TABS
25.0000 mg | ORAL_TABLET | Freq: Two times a day (BID) | ORAL | Status: DC
  Administered 2016-02-15 – 2016-02-19 (×8): 25 mg via ORAL
  Filled 2016-02-15 (×8): qty 1

## 2016-02-15 MED ORDER — LORAZEPAM 2 MG/ML IJ SOLN
1.0000 mg | Freq: Once | INTRAMUSCULAR | Status: AC
  Administered 2016-02-15: 1 mg via INTRAVENOUS
  Filled 2016-02-15: qty 1

## 2016-02-15 MED ORDER — TRAMADOL HCL 50 MG PO TABS: 50.0000 mg | ORAL_TABLET | Freq: Four times a day (QID) | ORAL | Status: DC | PRN

## 2016-02-15 MED ORDER — MAGNESIUM OXIDE 400 (241.3 MG) MG PO TABS
400.0000 mg | ORAL_TABLET | Freq: Two times a day (BID) | ORAL | Status: DC
  Administered 2016-02-16 – 2016-02-19 (×7): 400 mg via ORAL
  Filled 2016-02-15 (×7): qty 1

## 2016-02-15 MED ORDER — LABETALOL HCL 5 MG/ML IV SOLN
20.0000 mg | Freq: Once | INTRAVENOUS | Status: AC
  Administered 2016-02-15: 20 mg via INTRAVENOUS
  Filled 2016-02-15: qty 4

## 2016-02-15 MED ORDER — ENOXAPARIN SODIUM 40 MG/0.4ML ~~LOC~~ SOLN
40.0000 mg | SUBCUTANEOUS | Status: DC
  Administered 2016-02-15 – 2016-02-18 (×4): 40 mg via SUBCUTANEOUS
  Filled 2016-02-15 (×4): qty 0.4

## 2016-02-15 MED ORDER — TRAMADOL HCL 50 MG PO TABS
50.0000 mg | ORAL_TABLET | Freq: Four times a day (QID) | ORAL | Status: AC | PRN
Start: 2016-02-15 — End: 2016-02-16
  Administered 2016-02-15 – 2016-02-16 (×2): 50 mg via ORAL
  Filled 2016-02-15 (×2): qty 1

## 2016-02-15 MED ORDER — NICOTINE 21 MG/24HR TD PT24
21.0000 mg | MEDICATED_PATCH | Freq: Every day | TRANSDERMAL | Status: DC
  Administered 2016-02-16 – 2016-02-19 (×4): 21 mg via TRANSDERMAL
  Filled 2016-02-15 (×4): qty 1

## 2016-02-15 MED ORDER — VITAMIN B-1 100 MG PO TABS
100.0000 mg | ORAL_TABLET | Freq: Every day | ORAL | Status: DC
  Administered 2016-02-16 – 2016-02-19 (×4): 100 mg via ORAL
  Filled 2016-02-15 (×4): qty 1

## 2016-02-15 MED ORDER — LORAZEPAM 1 MG PO TABS
0.0000 mg | ORAL_TABLET | Freq: Four times a day (QID) | ORAL | Status: AC
  Administered 2016-02-15 – 2016-02-17 (×6): 1 mg via ORAL
  Filled 2016-02-15 (×6): qty 1

## 2016-02-15 MED ORDER — SODIUM CHLORIDE 0.9% FLUSH
3.0000 mL | Freq: Two times a day (BID) | INTRAVENOUS | Status: DC
  Administered 2016-02-15 – 2016-02-18 (×6): 3 mL via INTRAVENOUS

## 2016-02-15 MED ORDER — LORAZEPAM 1 MG PO TABS
0.0000 mg | ORAL_TABLET | Freq: Two times a day (BID) | ORAL | Status: AC
  Administered 2016-02-17 – 2016-02-18 (×3): 1 mg via ORAL
  Filled 2016-02-15 (×3): qty 1

## 2016-02-15 MED ORDER — LACTULOSE 10 GM/15ML PO SOLN
20.0000 g | Freq: Every day | ORAL | Status: DC
  Administered 2016-02-16: 20 g via ORAL
  Filled 2016-02-15: qty 30

## 2016-02-15 MED ORDER — MAGNESIUM SULFATE 4 GM/100ML IV SOLN
4.0000 g | Freq: Once | INTRAVENOUS | Status: AC
  Administered 2016-02-15: 4 g via INTRAVENOUS
  Filled 2016-02-15: qty 100

## 2016-02-15 MED ORDER — SODIUM CHLORIDE 0.9 % IV BOLUS (SEPSIS)
1000.0000 mL | Freq: Once | INTRAVENOUS | Status: AC
  Administered 2016-02-15: 1000 mL via INTRAVENOUS

## 2016-02-15 MED ORDER — ADULT MULTIVITAMIN W/MINERALS CH
1.0000 | ORAL_TABLET | Freq: Every day | ORAL | Status: DC
  Administered 2016-02-16 – 2016-02-19 (×4): 1 via ORAL
  Filled 2016-02-15 (×4): qty 1

## 2016-02-15 NOTE — H&P (Signed)
History and Physical    Albert Lara ZOX:[id number] DOB: [birth year] DOA: 02/15/2016  PCP: Almedia Balls, MD   Patient coming from: Home  Chief Complaint: High blood pressure.  HPI: Albert Lara is a 40 y.o. male with medical history significant of alcohol abuse, tobacco abuse disorder, hypertension, GERD, peripheral neuropathy who was recently admitted and discharged from 01/26/2016 to 02/02/2016 due to multiple acute electrolyte abnormalities and alcohol detoxification who presented to [facility] due to hypertension and early DTs symptoms.  Per patient, he was trying to get into a rehabilitation facility earlier this week, but was turned down due to not having his prescription medications. He states that he subsequently got the prescriptions, but was not accepted into the rehabilitation facility due to hypertension. He denies headache, blurry vision, chest pain, palpitations, dizziness, diaphoresis, pitting edema of the lower extremities, PND or orthopnea.  ED Course: The patient was given Ativan and labetalol reporting relief. Workup showed leukocytosis of 16.2 K, macrocytic anemia with a hemoglobin level of 11.6 g/dL, hypomagnesemia with 1.3 mg/dL, mild increased ammonia level 44 mmol/L.   Review of Systems: As per HPI otherwise 10 point review of systems negative.   Past Medical History:  Diagnosis Date  . ETOH abuse   . GERD (gastroesophageal reflux disease)   . Hypokalemia   . Peripheral neuropathy [facility])     Past Surgical History:  Procedure Laterality Date  . TONSILLECTOMY AND ADENOIDECTOMY  ~ 1988     reports that he has been smoking Cigarettes.  He has a 21.00 pack-year smoking history. He has never used smokeless tobacco. He reports that he drinks about 134.4 oz of alcohol per week . He reports that he uses drugs, including Marijuana.  No Known Allergies  Family History  Problem Relation Age of Onset  . Diabetes Mother   . Hypertension  Mother     Prior to Admission medications   Medication Sig Start Date End Date Taking? Authorizing Provider  amitriptyline (ELAVIL) 25 MG tablet Take 1 tablet (25 mg total) by mouth at bedtime. Patient not taking: Reported on 02/12/2016 01/31/16   Belkys A Regalado, MD  calcium carbonate (OS-CAL - DOSED IN MG OF ELEMENTAL CALCIUM) 1250 (500 Ca) MG tablet Take 1 tablet (500 mg of elemental calcium total) by mouth 2 (two) times daily with a meal. Patient not taking: Reported on 02/12/2016 01/31/16   Belkys A Regalado, MD  feeding supplement (BOOST / RESOURCE BREEZE) LIQD Take 1 Container by mouth 3 (three) times daily between meals. Patient not taking: Reported on 02/12/2016 01/31/16   Alba Cory, MD  folic acid (FOLVITE) 1 MG tablet Take 1 tablet (1 mg total) by mouth daily. Patient not taking: Reported on 02/12/2016 01/31/16   Belkys A Regalado, MD  gabapentin (NEURONTIN) 400 MG capsule Take 1 capsule (400 mg total) by mouth 3 (three) times daily. Patient not taking: Reported on 02/12/2016 01/31/16   Belkys A Regalado, MD  lactulose (CHRONULAC) 10 GM/15ML solution Take 45 mLs (30 g total) by mouth 3 (three) times daily. Patient not taking: Reported on 02/12/2016 02/02/16   Haydee Salter, MD  magnesium oxide (MAG-OX) 400 (241.3 Mg) MG tablet Take 1 tablet (400 mg total) by mouth 2 (two) times daily. Patient not taking: Reported on 02/12/2016 01/31/16   Belkys A Regalado, MD  metoprolol tartrate (LOPRESSOR) 25 MG tablet Take 1 tablet (25 mg total) by mouth 2 (two) times daily. Patient not taking: Reported on 02/12/2016 01/31/16   Belkys A  Regalado, MD  Multiple Vitamin (MULTIVITAMIN WITH MINERALS) TABS tablet Take 1 tablet by mouth daily. Patient not taking: Reported on 02/12/2016 01/31/16   Belkys A Regalado, MD  thiamine 100 MG tablet Take 1 tablet (100 mg total) by mouth daily. Patient not taking: Reported on 02/12/2016 01/31/16   Belkys A Regalado, MD  traMADol (ULTRAM) 50 MG tablet Take 1 tablet (50 mg  total) by mouth every 6 (six) hours as needed for moderate pain. Patient not taking: Reported on 02/12/2016 01/31/16   Belkys A Regalado, MD  vitamin B-12 1000 MCG tablet Take 1 tablet (1,000 mcg total) by mouth daily. Patient not taking: Reported on 02/12/2016 01/31/16   Belkys A Regalado, MD  Vitamin D, Ergocalciferol, (DRISDOL) 50000 units CAPS capsule Take 1 capsule (50,000 Units total) by mouth every 7 (seven) days. Patient not taking: Reported on 02/12/2016 01/31/16   Alba CoryBelkys A Regalado, MD    Physical Exam: Vitals:   02/15/16 1530 02/15/16 1613 02/15/16 1724 02/15/16 2008  BP: 134/91 (!) 121/101 (!) 141/101 (!) 140/101  Pulse: 93 96 92 99  Resp: 12 18 18 18   Temp:  98.5 F (36.9 C) 97.8 F (36.6 C) 98.3 F (36.8 C)  TempSrc:  Oral Oral Oral  SpO2: 100% 100% 100% 100%  Weight:   67.7 kg (149 lb 4 oz)   Height:   5\' 9"  (1.753 m)       Constitutional: NAD, calm, comfortable Vitals:   02/15/16 1530 02/15/16 1613 02/15/16 1724 02/15/16 2008  BP: 134/91 (!) 121/101 (!) 141/101 (!) 140/101  Pulse: 93 96 92 99  Resp: 12 18 18 18   Temp:  98.5 F (36.9 C) 97.8 F (36.6 C) 98.3 F (36.8 C)  TempSrc:  Oral Oral Oral  SpO2: 100% 100% 100% 100%  Weight:   67.7 kg (149 lb 4 oz)   Height:   5\' 9"  (1.753 m)    Eyes: PERRL, lids and conjunctivae normal ENMT: Mucous membranes are moist. Posterior pharynx clear of any exudate or lesions. Dentition in poor state of repair with most upper teeth absent.  Neck: normal, supple, no masses, no thyromegaly Respiratory: clear to auscultation bilaterally, no wheezing, no crackles. Normal respiratory effort. No accessory muscle use.  Cardiovascular: Regular rate and rhythm, no murmurs / rubs / gallops. No extremity edema. 2+ pedal pulses. No carotid bruits.  Abdomen: Bowel sounds positive, soft, no tenderness, no masses palpated.   Musculoskeletal: no clubbing / cyanosis. No joint deformity upper and lower extremities. Good ROM, no contractures. Normal  muscle tone.  Skin: no rashes, lesions, ulcers. No induration Neurologic: CN 2-12 grossly intact. Sensation intact, DTR normal. No tremors. Strength 5/5 in all 4.  Psychiatric: Normal judgment and insight. Alert and oriented x 4. Normal mood.     Labs on Admission: I have personally reviewed following labs and imaging studies  CBC:  Recent Labs Lab 02/11/16 1950 02/12/16 0425 02/15/16 1020  WBC 15.3* 14.0* 16.2*  NEUTROABS 10.6*  --  12.1*  HGB 12.3* 10.9* 11.6*  HCT 33.7* 32.1* 33.1*  MCV 103.7* 109.6* 107.5*  PLT 468* 392 269   Basic Metabolic Panel:  Recent Labs Lab 02/11/16 1950 02/12/16 0425 02/15/16 1020 02/15/16 1918  NA 133* 137 135 135  K 2.6* 3.9 4.1 4.1  CL 95* 102 105 104  CO2 24 24 23 26   GLUCOSE 113* 126* 101* 117*  BUN 3* <5* 9 5*  CREATININE 0.51* 0.62 0.58* 0.65  CALCIUM 8.5* 8.4* 8.7* 8.4*  MG 1.3* 1.7  --  1.3*   GFR: Estimated Creatinine Clearance: 118.7 mL/min (by C-G formula based on SCr of 0.8 mg/dL). Liver Function Tests:  Recent Labs Lab 02/11/16 1950 02/15/16 1918  AST 108* 58*  ALT 52 34  ALKPHOS 191* 143*  BILITOT 0.7 0.7  PROT 7.0 6.1*  ALBUMIN 3.3* 2.9*   No results for input(s): LIPASE, AMYLASE in the last 168 hours.  Recent Labs Lab 02/15/16 1918  AMMONIA 44*   Coagulation Profile:  Recent Labs Lab 02/15/16 1918  INR 1.32   Cardiac Enzymes:  Recent Labs Lab 02/15/16 1020 02/15/16 1235  CKTOTAL 51  --   TROPONINI  --  <0.03    Urine analysis:    Component Value Date/Time   COLORURINE YELLOW 02/11/2016 2153   APPEARANCEUR CLEAR 02/11/2016 2153   LABSPEC 1.010 02/11/2016 2153   PHURINE 6.5 02/11/2016 2153   GLUCOSEU NEGATIVE 02/11/2016 2153   HGBUR NEGATIVE 02/11/2016 2153   BILIRUBINUR NEGATIVE 02/11/2016 2153   KETONESUR NEGATIVE 02/11/2016 2153   PROTEINUR NEGATIVE 02/11/2016 2153   NITRITE NEGATIVE 02/11/2016 2153   LEUKOCYTESUR NEGATIVE 02/11/2016 2153     Recent Results (from the past  240 hour(s))  Culture, blood (Routine X 2) w Reflex to ID Panel     Status: None (Preliminary result)   Collection Time: 02/11/16  9:25 PM  Result Value Ref Range Status   Specimen Description BLOOD LEFT ANTECUBITAL  Final   Special Requests   Final    BOTTLES DRAWN AEROBIC AND ANAEROBIC AER 5cc ANA 5cc   Culture   Final    NO GROWTH 3 DAYS [facility]formed [facility]    Report Status PENDING  Incomplete  Culture, blood (Routine X 2) w Reflex to ID Panel     Status: None (Preliminary result)   Collection Time: 02/11/16 10:15 PM  Result Value Ref Range Status   Specimen Description BLOOD RIGHT ANTECUBITAL  Final   Special Requests   Final    BOTTLES DRAWN AEROBIC AND ANAEROBIC AER 5cc ANA 5cc   Culture   Final    NO GROWTH 3 DAYS Performed at [facility]    Report Status PENDING  Incomplete       Assessment/Plan Principal Problem:   Alcohol withdrawal (HCC) No signs of withdrawal at this time, but the patient was medicated earlier..  Admit to telemetry/inpatient. Continue CiWA protocol. Continue folic acid, multivitamin and thiamine supplementation. The patient would like to go to a rehabilitation facility.  Active Problems:   Hypomagnesemia Replacing with magnesium sulfate.    Peripheral neuropathy (HCC) Resume gabapentin.    Hyperammonemia (HCC) After discussing with the patient, will try using lactulose once a day  given low ammonia level and taste discomfort reported by the patient.    Essential hypertension Resume metoprolol 25 mg by mouth twice a day. Monitor blood pressure and adjust therapy as needed.    Leukocytosis Denies having any fever. Check chest radiograph and urine analysis. Repeat CBC in the morning.    Macrocytic anemia Advised to cease alcohol to avoid bone marrow suppression. Continue vitamin B12 and folic acid supplementation. Monitor hematocrit and hemoglobin.     Tobacco use disorder Nicotine replacement therapy  ordered.   DVT prophylaxis: Lovenox SQ. Code Status: Full code. Family Communication: None. Disposition Plan: Admit for blood pressure control and CiWA protocol. Consults called: None. Admission status: Inpatient/telemetry.   Bobette Moavid Manuel Adaleena Mooers MD [facility] Pager [phone].  If 7PM-7AM, please contact  night-coverage [email or web address] Password [facility]  02/15/2016, 9:54 PM

## 2016-02-15 NOTE — ED Triage Notes (Signed)
Pt states he was recently d/c'd from [facility](Tues) because his K was high. Was given med for HTN, but has not started taking it as of yet.Tried to check in to Rehab facility for ETOH today, but they told him BP still too high and sent him here.

## 2016-02-15 NOTE — Progress Notes (Signed)
Patient arrived from [facility]. Patient alert and oriented times 4. MD aware patient arrival. Pt assessment done. Tele monitor placed. CCMD notified. Patient oriented to the unit and room. Patient belongings at bedside. Home Medications sent to pharmacy.   Valinda HoarLexie Mecca Barga RN

## 2016-02-15 NOTE — ED Notes (Signed)
IV site redressed after pt returned from bathroom.

## 2016-02-15 NOTE — ED Provider Notes (Signed)
[facility] Provider Note   CSN: [id number] Arrival date & time: 02/15/16  0946     History   Chief Complaint Chief Complaint  Patient presents with  . Hypertension    HPI Albert Lara is a 40 y.o. male.  Patient presents to the emergency department with chief complaint of hypertension. He states that he was recently admitted to the hospital for hypokalemia. He states he has been trying to get into rehabilitation for alcohol abuse. He states that he tried to be seen by the rehabilitation center today, and was referred back to the emergency department for hypertension. He states that he was given prescription antihypertensive medication at the end of his hospital admission, but was unable to get the prescriptions filled until yesterday. He has not started taking the medicine yet. Additionally, he states that his last drink was on Friday. He denies any tremors, or seizure like activity. He states that he is very frustrated at not being able to get into rehabilitation today. He denies any other symptoms.   The history is provided by the patient. No language interpreter was used.    Past Medical History:  Diagnosis Date  . ETOH abuse   . Hypokalemia   . Peripheral neuropathy [facility])     Patient Active Problem List   Diagnosis Date Noted  . High serum lactate 02/12/2016  . Alcohol withdrawal (HCC) 02/12/2016  . Hyperammonemia (HCC) 02/01/2016  . Wernicke encephalopathy   . Hypomagnesemia 01/27/2016  . Vision changes 01/27/2016  . Hypocalcemia 01/27/2016  . Alcohol use disorder, severe, dependence (HCC) 01/27/2016  . Leg swelling 01/27/2016  . Peripheral neuropathy (HCC) 01/27/2016  . Hypokalemia 01/27/2016  . Malnutrition of moderate degree 01/27/2016  . Acute hypokalemia 01/26/2016  . Abnormal levels of other serum enzymes 12/22/2013    Past Surgical History:  Procedure Laterality Date  . TONSILLECTOMY         Home Medications    Prior to  Admission medications   Medication Sig Start Date End Date Taking? Authorizing Provider  meloxicam (MOBIC) 7.5 MG tablet Take 7.5 mg by mouth daily.   Yes Historical Provider, MD  amitriptyline (ELAVIL) 25 MG tablet Take 1 tablet (25 mg total) by mouth at bedtime. Patient not taking: Reported on 02/12/2016 01/31/16   Belkys A Regalado, MD  calcium carbonate (OS-CAL - DOSED IN MG OF ELEMENTAL CALCIUM) 1250 (500 Ca) MG tablet Take 1 tablet (500 mg of elemental calcium total) by mouth 2 (two) times daily with a meal. Patient not taking: Reported on 02/12/2016 01/31/16   Belkys A Regalado, MD  feeding supplement (BOOST / RESOURCE BREEZE) LIQD Take 1 Container by mouth 3 (three) times daily between meals. Patient not taking: Reported on 02/12/2016 01/31/16   Alba Cory, MD  folic acid (FOLVITE) 1 MG tablet Take 1 tablet (1 mg total) by mouth daily. Patient not taking: Reported on 02/12/2016 01/31/16   Belkys A Regalado, MD  gabapentin (NEURONTIN) 400 MG capsule Take 1 capsule (400 mg total) by mouth 3 (three) times daily. Patient not taking: Reported on 02/12/2016 01/31/16   Belkys A Regalado, MD  lactulose (CHRONULAC) 10 GM/15ML solution Take 45 mLs (30 g total) by mouth 3 (three) times daily. Patient not taking: Reported on 02/12/2016 02/02/16   Haydee Salter, MD  magnesium oxide (MAG-OX) 400 (241.3 Mg) MG tablet Take 1 tablet (400 mg total) by mouth 2 (two) times daily. Patient not taking: Reported on 02/12/2016 01/31/16   Belkys A Regalado,  MD  metoprolol tartrate (LOPRESSOR) 25 MG tablet Take 1 tablet (25 mg total) by mouth 2 (two) times daily. Patient not taking: Reported on 02/12/2016 01/31/16   Belkys A Regalado, MD  Multiple Vitamin (MULTIVITAMIN WITH MINERALS) TABS tablet Take 1 tablet by mouth daily. Patient not taking: Reported on 02/12/2016 01/31/16   Belkys A Regalado, MD  thiamine 100 MG tablet Take 1 tablet (100 mg total) by mouth daily. Patient not taking: Reported on 02/12/2016 01/31/16   Belkys A  Regalado, MD  traMADol (ULTRAM) 50 MG tablet Take 1 tablet (50 mg total) by mouth every 6 (six) hours as needed for moderate pain. Patient not taking: Reported on 02/12/2016 01/31/16   Belkys A Regalado, MD  vitamin B-12 1000 MCG tablet Take 1 tablet (1,000 mcg total) by mouth daily. Patient not taking: Reported on 02/12/2016 01/31/16   Belkys A Regalado, MD  Vitamin D, Ergocalciferol, (DRISDOL) 50000 units CAPS capsule Take 1 capsule (50,000 Units total) by mouth every 7 (seven) days. Patient not taking: Reported on 02/12/2016 01/31/16   Alba CoryBelkys A Regalado, MD    Family History Family History  Problem Relation Age of Onset  . Diabetes Mother     Social History Social History  Substance Use Topics  . Smoking status: Current Every Day Smoker    Packs/day: 1.00    Years: 20.00    Types: Cigarettes  . Smokeless tobacco: Never Used  . Alcohol use Yes     Comment: quart of liquor daily     Allergies   Review of patient's allergies indicates no known allergies.   Review of Systems Review of Systems  All other systems reviewed and are negative.    Physical Exam Updated Vital Signs BP (!) 132/107   Pulse (!) 123   Temp 98.3 F (36.8 C) (Oral)   Resp 18   Ht 5\' 9"  (1.753 m)   Wt 68 kg   SpO2 100%   BMI 22.15 kg/m   Physical Exam  Constitutional: He is oriented to person, place, and time. He appears well-developed and well-nourished.  HENT:  Head: Normocephalic and atraumatic.  Eyes: Conjunctivae and EOM are normal. Pupils are equal, round, and reactive to light. Right eye exhibits no discharge. Left eye exhibits no discharge. No scleral icterus.  Neck: Normal range of motion. Neck supple. No JVD present.  Cardiovascular: Regular rhythm and normal heart sounds.  Exam reveals no gallop and no friction rub.   No murmur heard. Tachycardic  Pulmonary/Chest: Effort normal and breath sounds normal. No respiratory distress. He has no wheezes. He has no rales. He exhibits no  tenderness.  Abdominal: Soft. He exhibits no distension and no mass. There is no tenderness. There is no rebound and no guarding.  Musculoskeletal: Normal range of motion. He exhibits no edema or tenderness.  Neurological: He is alert and oriented to person, place, and time.  Skin: Skin is warm and dry.  Psychiatric: He has a normal mood and affect. His behavior is normal. Judgment and thought content normal.  Nursing note and vitals reviewed.    ED Treatments / Results  Labs (all labs ordered are listed, but only abnormal results are displayed) Labs Reviewed  CBC WITH DIFFERENTIAL/PLATELET  BASIC METABOLIC PANEL    EKG  EKG Interpretation None       Radiology No results found.  Procedures Procedures (including critical care time)  Medications Ordered in ED Medications  sodium chloride 0.9 % bolus 1,000 mL (not administered)  LORazepam (ATIVAN)  injection 1 mg (not administered)  labetalol (NORMODYNE,TRANDATE) injection 20 mg (not administered)     Initial Impression / Assessment and Plan / ED Course  I have reviewed the triage vital signs and the nursing notes.  Pertinent labs & imaging results that were available during my care of the patient were reviewed by me and considered in my medical decision making (see chart for details).  Clinical Course    Patient here for hypertension. He is not compliant in taking his antihypertensive medication. He is trying to get into a rehabilitation center for alcohol abuse. His last drink was on Friday. He was on CIWA in the hospital. I will give him some Ativan. He has not had any seizure-like activity, but is a bit tremulous and irritable. Will also give fluids. Will check labs given that he had recent severe hypokalemia. We'll also give him a dose of blood pressure medicine.  Patient seen by and discussed with Dr. Ranae PalmsYelverton.  Concern that patient is still withdrawing giving vitals.  Will start CIWA.    Apprec[facility] Kon[facility]Merrell  [facility] for [facility] for transfer and admission.   Final Clinical Impressions(s) / ED Diagnoses   Final diagnoses:  Alcohol withdrawal, with unspecified complication [facility](HCC)    New Prescriptions New Prescriptions   No medications on file     Roxy HorsemanRobert Kaheem Halleck, PA-C 02/15/16 1221    Loren Raceravid Yelverton, MD 02/15/16 [phone]

## 2016-02-15 NOTE — ED Notes (Signed)
Pt given cheese crackers and ginger ale.

## 2016-02-15 NOTE — Progress Notes (Signed)
Patient coming from [facility]. Patient with a known history of alcohol abuse and is actively trying to stop drinking. Last alcoholic beverage was on 02/10/2016. Patient presenting in [facility] tremulous, agitated, hypertensive and tachycardic. Labetalol IV with limited improvement. Ativan given for withdrawal symptoms. Patient accepted to telemetry for inpatient admission. I have asked that a CK, UDS, and troponin also be drawn prior to sending [facility]. Of note patient also with WBC 16.2, hemoglobin 11.6, MCV 107.5, neutrophil count 12.1, lactic acid 1.5. Blood pressure remains elevated with a narrow pulse pressure, with most recent reading of 140/111.  Shelly Flattenavid Lylia Karn, MD [facility] 02/15/2016, 12:16 PM

## 2016-02-16 DIAGNOSIS — D72829 Elevated white blood cell count, unspecified: Secondary | ICD-10-CM

## 2016-02-16 DIAGNOSIS — F05 Delirium due to known physiological condition: Secondary | ICD-10-CM

## 2016-02-16 DIAGNOSIS — I10 Essential (primary) hypertension: Secondary | ICD-10-CM

## 2016-02-16 DIAGNOSIS — F10239 Alcohol dependence with withdrawal, unspecified: Secondary | ICD-10-CM

## 2016-02-16 DIAGNOSIS — D539 Nutritional anemia, unspecified: Secondary | ICD-10-CM

## 2016-02-16 LAB — COMPREHENSIVE METABOLIC PANEL
ALBUMIN: 3 g/dL — AB (ref 3.5–5.0)
ALK PHOS: 156 U/L — AB (ref 38–126)
ALT: 36 U/L (ref 17–63)
AST: 58 U/L — AB (ref 15–41)
Anion gap: 8 (ref 5–15)
CALCIUM: 8.2 mg/dL — AB (ref 8.9–10.3)
CHLORIDE: 104 mmol/L (ref 101–111)
CO2: 22 mmol/L (ref 22–32)
CREATININE: 0.53 mg/dL — AB (ref 0.61–1.24)
GFR calc Af Amer: >60 mL/min (ref >60)
GFR calc non Af Amer: >60 mL/min (ref >60)
GLUCOSE: 108 mg/dL — AB (ref 65–99)
Potassium: 3.9 mmol/L (ref 3.5–5.1)
SODIUM: 134 mmol/L — AB (ref 135–145)
Total Bilirubin: 0.7 mg/dL (ref 0.3–1.2)
Total Protein: 6.4 g/dL — ABNORMAL LOW (ref 6.5–8.1)

## 2016-02-16 LAB — CBC WITH DIFFERENTIAL/PLATELET
BASOS ABS: 0.1 10*3/uL (ref 0.0–0.1)
Basophils Relative: 1 %
EOS ABS: 0.5 10*3/uL (ref 0.0–0.7)
Eosinophils Relative: 3 %
HCT: 33.9 % — ABNORMAL LOW (ref 39.0–52.0)
HEMOGLOBIN: 11.4 g/dL — AB (ref 13.0–17.0)
LYMPHS ABS: 3.4 10*3/uL (ref 0.7–4.0)
Lymphocytes Relative: 24 %
MCH: 36.9 pg — AB (ref 26.0–34.0)
MCHC: 33.6 g/dL (ref 30.0–36.0)
MCV: 109.7 fL — ABNORMAL HIGH (ref 78.0–100.0)
Monocytes Absolute: 1 10*3/uL (ref 0.1–1.0)
Monocytes Relative: 7 %
NEUTROS PCT: 65 %
Neutro Abs: 8.9 10*3/uL — ABNORMAL HIGH (ref 1.7–7.7)
PLATELETS: 252 10*3/uL (ref 150–400)
RBC: 3.09 MIL/uL — AB (ref 4.22–5.81)
RDW: 15.9 % — ABNORMAL HIGH (ref 11.5–15.5)
WBC: 13.8 10*3/uL — AB (ref 4.0–10.5)

## 2016-02-16 LAB — VITAMIN B12: Vitamin B-12: 381 pg/mL (ref 180–914)

## 2016-02-16 LAB — AMMONIA: Ammonia: 75 umol/L — ABNORMAL HIGH (ref 9–35)

## 2016-02-16 LAB — MAGNESIUM: Magnesium: 2.2 mg/dL (ref 1.7–2.4)

## 2016-02-16 MED ORDER — TRAMADOL HCL 50 MG PO TABS
50.0000 mg | ORAL_TABLET | Freq: Four times a day (QID) | ORAL | Status: AC | PRN
  Administered 2016-02-16 – 2016-02-17 (×2): 50 mg via ORAL
  Filled 2016-02-16 (×2): qty 1

## 2016-02-16 NOTE — Progress Notes (Signed)
PROGRESS NOTE    Albert Lara  ZOX:[id number] DOB: 1975-08-26 DOA: 02/15/2016 PCP: Almedia Balls, MD    Brief Narrative:  Albert Lara is a 40 y.o. male with medical history significant of alcohol abuse, tobacco abuse disorder, hypertension, GERD, peripheral neuropathy who was recently admitted and discharged from 01/26/2016 to 02/02/2016 due to multiple acute electrolyte abnormalities and alcohol detoxification who presented to [facility] due to hypertension and early DTs symptoms.  Per patient, he was trying to get into a rehabilitation facility earlier this week, but was turned down due to not having his prescription medications. He states that he subsequently got the prescriptions, but was not accepted into the rehabilitation facility due to hypertension. He denies headache, blurry vision, chest pain, palpitations, dizziness, diaphoresis, pitting edema of the lower extremities, PND or orthopnea.  ED Course: The patient was given Ativan and labetalol reporting relief. Workup showed leukocytosis of 16.2 K, macrocytic anemia with a hemoglobin level of 11.6 g/dL, hypomagnesemia with 1.3 mg/dL, mild increased ammonia level 44 mmol/L.    Assessment & Plan:   Principal Problem:   Alcohol withdrawal (HCC) Active Problems:   Hypomagnesemia   Peripheral neuropathy (HCC)   Hyperammonemia (HCC)   Essential hypertension   Leukocytosis   Macrocytic anemia  #1 alcohol withdrawal Patient with no DVTs noted. Patient however noted to be somewhat confused per nursing stating that he was going outside and looking for relatives who have not been here. It was also noted that patient refused lactulose. Continue the Ativan withdrawal protocol. Continue thiamine, multivitamin, folic acid. Follow.  #2 hypertension Stable. Continue current regimen of Lopressor.  #3 confusion Per nursing patient with noted confusion stating it was snowing outside. Patient also looking for  family that have not been here per nursing. Patient was noted to have refused lactulose this morning. Check a thiamine level. Check an ammonia level. Will have nursing give patient is lactulose and follow.  #4 hypomagnesemia Replete.  #5 leukocytosis Questionable etiology. WBC trending down. Patient may have a chronic leukocytosis. Patient with no respiratory symptoms. Urinalysis obtained on admission was unremarkable. Recent chest x-ray obtained 02/11/2016 was negative for any acute infiltrate. Repeat chest x-ray. Patient is afebrile. Monitor for now.  #6 tobacco abuse Nicotine patch.   DVT prophylaxis: SCDs Code Status: Full Family Communication: Updated patient. No family at bedside. Disposition Plan: Home when medically stable with improvement with blood pressure.   Consultants:   None  Procedures:   None  Antimicrobials:   None   Subjective: Patient denies CP. No SOB. Per nursing patient with some confusion stating it snowing outside and looking for relatives that have not been here. Patient also noted to have refused lactulose. Patient with no DTs noted.  Objective: Vitals:   02/15/16 1724 02/15/16 2008 02/16/16 0436 02/16/16 1002  BP: (!) 141/101 (!) 140/101 (!) 130/96 (!) 139/99  Pulse: 92 99 (!) 110 (!) 116  Resp: 18 18 18    Temp: 97.8 F (36.6 C) 98.3 F (36.8 C) 98.3 F (36.8 C)   TempSrc: Oral Oral Oral   SpO2: 100% 100% 100% 100%  Weight: 67.7 kg (149 lb 4 oz)     Height: 5\' 9"  (1.753 m)       Intake/Output Summary (Last 24 hours) at 02/16/16 1209 Last data filed at 02/16/16 0600  Gross per 24 hour  Intake              360 ml  Output  301 ml  Net               59 ml   Filed Weights   02/15/16 0953 02/15/16 1724  Weight: 68 kg (150 lb) 67.7 kg (149 lb 4 oz)    Examination:  General exam: Appears calm and comfortable  Respiratory system: Clear to auscultation. Respiratory effort normal. Cardiovascular system: S1 & S2 heard, RRR.  No JVD, murmurs, rubs, gallops or clicks. No pedal edema. Gastrointestinal system: Abdomen is nondistended, soft and nontender. No organomegaly or masses felt. Normal bowel sounds heard. Central nervous system: Alert and oriented. No focal neurological deficits. Extremities: Symmetric 5 x 5 power. Skin: No rashes, lesions or ulcers Psychiatry: Judgement and insight appear normal. Mood & affect appropriate.     Data Reviewed: I have personally reviewed following labs and imaging studies  CBC:  Recent Labs Lab 02/11/16 1950 02/12/16 0425 02/15/16 1020 02/16/16 0253  WBC 15.3* 14.0* 16.2* 13.8*  NEUTROABS 10.6*  --  12.1* 8.9*  HGB 12.3* 10.9* 11.6* 11.4*  HCT 33.7* 32.1* 33.1* 33.9*  MCV 103.7* 109.6* 107.5* 109.7*  PLT 468* 392 269 252   Basic Metabolic Panel:  Recent Labs Lab 02/11/16 1950 02/12/16 0425 02/15/16 1020 02/15/16 1918 02/16/16 0253  NA 133* 137 135 135 134*  K 2.6* 3.9 4.1 4.1 3.9  CL 95* 102 105 104 104  CO2 24 24 23 26 22   GLUCOSE 113* 126* 101* 117* 108*  BUN 3* <5* 9 5* <5*  CREATININE 0.51* 0.62 0.58* 0.65 0.53*  CALCIUM 8.5* 8.4* 8.7* 8.4* 8.2*  MG 1.3* 1.7  --  1.3* 2.2   GFR: Estimated Creatinine Clearance: 118.7 mL/min (by C-G formula based on SCr of 0.8 mg/dL). Liver Function Tests:  Recent Labs Lab 02/11/16 1950 02/15/16 1918 02/16/16 0253  AST 108* 58* 58*  ALT 52 34 36  ALKPHOS 191* 143* 156*  BILITOT 0.7 0.7 0.7  PROT 7.0 6.1* 6.4*  ALBUMIN 3.3* 2.9* 3.0*   No results for input(s): LIPASE, AMYLASE in the last 168 hours.  Recent Labs Lab 02/15/16 1918  AMMONIA 44*   Coagulation Profile:  Recent Labs Lab 02/15/16 1918  INR 1.32   Cardiac Enzymes:  Recent Labs Lab 02/15/16 1020 02/15/16 1235  CKTOTAL 51  --   TROPONINI  --  <0.03   BNP (last 3 results) No results for input(s): PROBNP in the last 8760 hours. HbA1C: No results for input(s): HGBA1C in the last 72 hours. CBG: No results for input(s): GLUCAP  in the last 168 hours. Lipid Profile: No results for input(s): CHOL, HDL, LDLCALC, TRIG, CHOLHDL, LDLDIRECT in the last 72 hours. Thyroid Function Tests: No results for input(s): TSH, T4TOTAL, FREET4, T3FREE, THYROIDAB in the last 72 hours. Anemia Panel: No results for input(s): VITAMINB12, FOLATE, FERRITIN, TIBC, IRON, RETICCTPCT in the last 72 hours. Sepsis Labs:  Recent Labs Lab 02/11/16 2211 02/13/16 0045  LATICACIDVEN 2.29* 1.5    Recent Results (from the past 240 hour(s))  Culture, blood (Routine X 2) w Reflex to ID Panel     Status: None (Preliminary result)   Collection Time: 02/11/16  9:25 PM  Result Value Ref Range Status   Specimen Description BLOOD LEFT ANTECUBITAL  Final   Special Requests   Final    BOTTLES DRAWN AEROBIC AND ANAEROBIC AER 5cc ANA 5cc   Culture   Final    NO GROWTH 3 DAYS Performed at [facility]    Report Status PENDING  Incomplete  Culture, blood (Routine X 2) w Reflex to ID Panel     Status: None (Preliminary result)   Collection Time: 02/11/16 10:15 PM  Result Value Ref Range Status   Specimen Description BLOOD RIGHT ANTECUBITAL  Final   Special Requests   Final    BOTTLES DRAWN AEROBIC AND ANAEROBIC AER 5cc ANA 5cc   Culture   Final    NO GROWTH 3 DAYS [facility]formed [facility]    Report Status PENDING  Incomplete         Radiology Studies: No results found.      Scheduled Meds: . enoxaparin (LOVENOX) injection  40 mg Subcutaneous Q24H  . lactulose  20 g Oral Daily  . LORazepam  0-4 mg Oral Q6H   Followed by  . [START ON 02/17/2016] LORazepam  0-4 mg Oral Q12H  . magnesium oxide  400 mg Oral BID  . metoprolol tartrate  25 mg Oral BID  . multivitamin with minerals  1 tablet Oral Daily  . nicotine  21 mg Transdermal Daily  . sodium chloride flush  3 mL Intravenous Q12H  . thiamine  100 mg Oral Daily   Continuous Infusions:    LOS: 1 day    Time spent: 35 minutes    THOMPSON,DANIEL, MD [facility] Pager [phone]  If 7PM-7AM, please contact night-coverage [email or web address] Password [email or web address] 02/16/2016, 12:09 PM

## 2016-02-16 NOTE — Progress Notes (Signed)
Nutrition Brief Note  Patient identified on the Malnutrition Screening Tool (MST) Report.  Wt Readings from Last 15 Encounters:  02/15/16 149 lb 4 oz (67.7 kg)  02/02/16 150 lb (68 kg)  02/02/16 143 lb 11.2 oz (65.2 kg)    Body mass index is 22.04 kg/m. Patient meets criteria for Normal based on current BMI.   Current diet order is Heart Healthy, patient is consuming approximately 100% of meals at this time. Labs and medications reviewed.   No nutrition interventions warranted at this time. If nutrition issues arise, please consult RD.   Maureen ChattersKatie Mukhtar Shams, RD, LDN Pager #: [phone] After-Hours Pager #: [phone]

## 2016-02-16 NOTE — Progress Notes (Signed)
Pt continuously looking for his cousin and aunt that he believes to have been here this morning.  He says that they went to go out in the snow and he wants to go off the floor to get him.  He has not had any visitors on the floor today and seems confused, that he must have been "hallucinating".  Pt appears restless and pacing while frequently searching down the hall for family.  He insists that it is snowing despite being oriented to the outside windows to the sunny, 80* August day.  Disoriented to day of the week, but otherwise oriented.  Pt provided with crosswords and word searches for distraction.

## 2016-02-17 DIAGNOSIS — E7220 Disorder of urea cycle metabolism, unspecified: Secondary | ICD-10-CM

## 2016-02-17 DIAGNOSIS — F10230 Alcohol dependence with withdrawal, uncomplicated: Secondary | ICD-10-CM

## 2016-02-17 LAB — CBC
HEMATOCRIT: 33.2 % — AB (ref 39.0–52.0)
HEMOGLOBIN: 11 g/dL — AB (ref 13.0–17.0)
MCH: 35.9 pg — ABNORMAL HIGH (ref 26.0–34.0)
MCHC: 33.1 g/dL (ref 30.0–36.0)
MCV: 108.5 fL — AB (ref 78.0–100.0)
Platelets: 244 10*3/uL (ref 150–400)
RBC: 3.06 MIL/uL — ABNORMAL LOW (ref 4.22–5.81)
RDW: 15.6 % — AB (ref 11.5–15.5)
WBC: 12.7 10*3/uL — AB (ref 4.0–10.5)

## 2016-02-17 LAB — CULTURE, BLOOD (ROUTINE X 2)
CULTURE: NO GROWTH
Culture: NO GROWTH

## 2016-02-17 LAB — BASIC METABOLIC PANEL
ANION GAP: 8 (ref 5–15)
CHLORIDE: 100 mmol/L — AB (ref 101–111)
CO2: 21 mmol/L — ABNORMAL LOW (ref 22–32)
Calcium: 8.6 mg/dL — ABNORMAL LOW (ref 8.9–10.3)
Creatinine, Ser: 0.49 mg/dL — ABNORMAL LOW (ref 0.61–1.24)
GFR calc Af Amer: >60 mL/min (ref >60)
GFR calc non Af Amer: >60 mL/min (ref >60)
Glucose, Bld: 85 mg/dL (ref 65–99)
POTASSIUM: 4.2 mmol/L (ref 3.5–5.1)
SODIUM: 129 mmol/L — AB (ref 135–145)

## 2016-02-17 LAB — FOLATE RBC
FOLATE, RBC: 737 ng/mL (ref >498)
Folate, Hemolysate: 254.4 ng/mL
HEMATOCRIT: 34.5 % — AB (ref 37.5–51.0)

## 2016-02-17 LAB — RPR: RPR: NONREACTIVE

## 2016-02-17 LAB — HIV ANTIBODY (ROUTINE TESTING W REFLEX): HIV SCREEN 4TH GENERATION: NONREACTIVE

## 2016-02-17 LAB — MAGNESIUM: Magnesium: 1.8 mg/dL (ref 1.7–2.4)

## 2016-02-17 LAB — AMMONIA: Ammonia: 71 umol/L — ABNORMAL HIGH (ref 9–35)

## 2016-02-17 MED ORDER — FUROSEMIDE 10 MG/ML IJ SOLN
20.0000 mg | Freq: Once | INTRAMUSCULAR | Status: AC
  Administered 2016-02-17: 20 mg via INTRAVENOUS
  Filled 2016-02-17: qty 2

## 2016-02-17 MED ORDER — LACTULOSE 10 GM/15ML PO SOLN
30.0000 g | Freq: Three times a day (TID) | ORAL | Status: DC
  Administered 2016-02-17 – 2016-02-19 (×7): 30 g via ORAL
  Filled 2016-02-17 (×8): qty 45

## 2016-02-17 NOTE — Progress Notes (Signed)
Patient came up to the nurses desk around 04:35 asking if a tow truck had come. States that he called a tow company to come get his keys out of his car. Asked about the tow truck when I went in to give his Ativan and do his CIWA. Pt was not agitated and very calm.

## 2016-02-17 NOTE — Care Management Note (Addendum)
Case Management Note Marvetta Gibbons RN, BSN Unit 2W-Case Manager [phone]  Patient Details  Name: Albert Lara MRN: [id number] Date of Birth: 08/11/1975  Subjective/Objective:   Pt admitted with ETOH and HTN                 Action/Plan: Pt  Has been admitted x3 in mo of August- on first discharge per CM notes f/u was made with [facility]- CSW also met with pt regarding substance abuse on each visit- pt currently confused unable to speak with pt to see what happened regarding f/u with clinic and if he got his medications filled  (per MD notes he was able to fill them and was trying to get into a rehab center for ETOH abuse)- CM will f/u with pt prior to discharge when more alert - pt will need clinic f/u - and possible medication assistance would be eligible for MATCH as he is not in the system as having used it in the last 12 mo.   Per CM note on first visit in Aug. He was living with cousin in [place]- having recently moved here from [facility]- he also stated on that visit that he had insurance?   cousin [place] cell [phone], work [phone]  Expected Discharge Date:                  Expected Discharge Plan:  Home/Self Care  In-House Referral:     Discharge planning Services  CM Consult, Medication Assistance, [facility]  Post Acute Care Choice:    Choice offered to:     DME Arranged:    DME Agency:     HH Arranged:    [place] Agency:     Status of Service:  In process, will continue to follow  If discussed at Long Length of Stay Meetings, dates discussed:    Additional Comments:  Dawayne Patricia, RN 02/17/2016, 10:30 AM

## 2016-02-17 NOTE — Progress Notes (Signed)
PROGRESS NOTE    Albert Lara  ZOX:[id number] DOB: [birth year] DOA: 02/15/2016 PCP: Almedia Balls, MD    Brief Narrative:  Albert Lara is a 40 y.o. male with medical history significant of alcohol abuse, tobacco abuse disorder, hypertension, GERD, peripheral neuropathy who was recently admitted and discharged from 01/26/2016 to 02/02/2016 due to multiple acute electrolyte abnormalities and alcohol detoxification who presented to [facility] due to hypertension and early DTs symptoms.  Per patient, he was trying to get into a rehabilitation facility earlier this week, but was turned down due to not having his prescription medications. He states that he subsequently got the prescriptions, but was not accepted into the rehabilitation facility due to hypertension. He denies headache, blurry vision, chest pain, palpitations, dizziness, diaphoresis, pitting edema of the lower extremities, PND or orthopnea.  ED Course: The patient was given Ativan and labetalol reporting relief. Workup showed leukocytosis of 16.2 K, macrocytic anemia with a hemoglobin level of 11.6 g/dL, hypomagnesemia with 1.3 mg/dL, mild increased ammonia level 44 mmol/L.    Assessment & Plan:   Principal Problem:   Alcohol withdrawal (HCC) Active Problems:   Hypomagnesemia   Peripheral neuropathy (HCC)   Hyperammonemia (HCC)   Essential hypertension   Leukocytosis   Macrocytic anemia   Acute confusional state  #1 alcohol withdrawal Patient with no DTs noted. Patient however noted to be somewhat confused per nursing stating that he was going outside and looking for relatives who have not been here. It was also noted that patient refused lactulose. Continue the Ativan withdrawal protocol, thiamine, multivitamin, folic acid. Follow.  #2 hypertension Stable. Continue current regimen of Lopressor.  #3 confusion/elevated ammonia levels Per nursing patient with noted confusion stating it was  snowing outside. Patient also looking for family that have not been here per nursing. Patient was noted to have refused lactulose yesterday morning. Ammonia level is elevated. Folate level 737. B-12 levels at 381. Increase lactulose to 3 times a day. Follow.   #4 hypomagnesemia Replete.  #5 leukocytosis Questionable etiology. WBC trending down. Patient may have a chronic leukocytosis. Patient with no respiratory symptoms. Urinalysis obtained on admission was unremarkable. Recent chest x-ray obtained 02/11/2016 was negative for any acute infiltrate. Patient is afebrile. Monitor for now.  #6 tobacco abuse Nicotine patch.   DVT prophylaxis: SCDs Code Status: Full Family Communication: Updated patient. No family at bedside. Disposition Plan: Home when medically stable with improvement with blood pressure and confusion.   Consultants:   None  Procedures:   None  Antimicrobials:   None   Subjective: Patient denies CP. No SOB. Per nursing patient with some confusion yesterday as well as this morning. Patient with no DTs noted.  Objective: Vitals:   02/16/16 1443 02/16/16 2105 02/17/16 0504 02/17/16 1330  BP: (!) 140/99 (!) 131/94 (!) 132/100 (!) 111/93  Pulse: 96 (!) 101 97 96  Resp: 20 18 18 17   Temp: 98.1 F (36.7 C) 98.7 F (37.1 C) 98.7 F (37.1 C) 98.2 F (36.8 C)  TempSrc: Oral Oral Oral Oral  SpO2: 100% 98% 100% 99%  Weight:      Height:        Intake/Output Summary (Last 24 hours) at 02/17/16 1847 Last data filed at 02/17/16 1500  Gross per 24 hour  Intake                0 ml  Output              702 ml  Net             -  702 ml   Filed Weights   02/15/16 0953 02/15/16 1724  Weight: 68 kg (150 lb) 67.7 kg (149 lb 4 oz)    Examination:  General exam: Appears calm and comfortable  Respiratory system: Clear to auscultation. Respiratory effort normal. Cardiovascular system: S1 & S2 heard, RRR. No JVD, murmurs, rubs, gallops or clicks. No pedal  edema. Gastrointestinal system: Abdomen is nondistended, soft and nontender. No organomegaly or masses felt. Normal bowel sounds heard. Central nervous system: Alert and oriented. No focal neurological deficits. Extremities: Symmetric 5 x 5 power. Skin: No rashes, lesions or ulcers Psychiatry: Judgement and insight appear normal. Mood & affect appropriate.     Data Reviewed: I have personally reviewed following labs and imaging studies  CBC:  Recent Labs Lab 02/11/16 1950 02/12/16 0425 02/15/16 1020 02/16/16 0253 02/16/16 1928 02/17/16 0249  WBC 15.3* 14.0* 16.2* 13.8*  --  12.7*  NEUTROABS 10.6*  --  12.1* 8.9*  --   --   HGB 12.3* 10.9* 11.6* 11.4*  --  11.0*  HCT 33.7* 32.1* 33.1* 33.9* 34.5* 33.2*  MCV 103.7* 109.6* 107.5* 109.7*  --  108.5*  PLT 468* 392 269 252  --  244   Basic Metabolic Panel:  Recent Labs Lab 02/11/16 1950 02/12/16 0425 02/15/16 1020 02/15/16 1918 02/16/16 0253 02/17/16 0249  NA 133* 137 135 135 134* 129*  K 2.6* 3.9 4.1 4.1 3.9 4.2  CL 95* 102 105 104 104 100*  CO2 24 24 23 26 22  21*  GLUCOSE 113* 126* 101* 117* 108* 85  BUN 3* <5* 9 5* <5* <5*  CREATININE 0.51* 0.62 0.58* 0.65 0.53* 0.49*  CALCIUM 8.5* 8.4* 8.7* 8.4* 8.2* 8.6*  MG 1.3* 1.7  --  1.3* 2.2 1.8   GFR: Estimated Creatinine Clearance: 118.7 mL/min (by C-G formula based on SCr of 0.8 mg/dL). Liver Function Tests:  Recent Labs Lab 02/11/16 1950 02/15/16 1918 02/16/16 0253  AST 108* 58* 58*  ALT 52 34 36  ALKPHOS 191* 143* 156*  BILITOT 0.7 0.7 0.7  PROT 7.0 6.1* 6.4*  ALBUMIN 3.3* 2.9* 3.0*   No results for input(s): LIPASE, AMYLASE in the last 168 hours.  Recent Labs Lab 02/15/16 1918 02/16/16 1928 02/17/16 0249  AMMONIA 44* 75* 71*   Coagulation Profile:  Recent Labs Lab 02/15/16 1918  INR 1.32   Cardiac Enzymes:  Recent Labs Lab 02/15/16 1020 02/15/16 1235  CKTOTAL 51  --   TROPONINI  --  <0.03   BNP (last 3 results) No results for  input(s): PROBNP in the last 8760 hours. HbA1C: No results for input(s): HGBA1C in the last 72 hours. CBG: No results for input(s): GLUCAP in the last 168 hours. Lipid Profile: No results for input(s): CHOL, HDL, LDLCALC, TRIG, CHOLHDL, LDLDIRECT in the last 72 hours. Thyroid Function Tests: No results for input(s): TSH, T4TOTAL, FREET4, T3FREE, THYROIDAB in the last 72 hours. Anemia Panel:  Recent Labs  02/16/16 1928  VITAMINB12 381   Sepsis Labs:  Recent Labs Lab 02/11/16 2211 02/13/16 0045  LATICACIDVEN 2.29* 1.5    Recent Results (from the past 240 hour(s))  Culture, blood (Routine X 2) w Reflex to ID Panel     Status: None   Collection Time: 02/11/16  9:25 PM  Result Value Ref Range Status   Specimen Description BLOOD LEFT ANTECUBITAL  Final   Special Requests   Final    BOTTLES DRAWN AEROBIC AND ANAEROBIC AER 5cc ANA 5cc   Culture  Final    NO GROWTH 5 DAYS [facility]    Report Status 02/17/2016 FINAL  Final  Culture, blood (Routine X 2) w Reflex to ID Panel     Status: None   Collection Time: 02/11/16 10:15 PM  Result Value Ref Range Status   Specimen Description BLOOD RIGHT ANTECUBITAL  Final   Special Requests   Final    BOTTLES DRAWN AEROBIC AND ANAEROBIC AER 5cc ANA 5cc   Culture   Final    NO GROWTH 5 DAYS [facility]     Report Status 02/17/2016 FINAL  Final         Radiology Studies: No results found.      Scheduled Meds: . enoxaparin (LOVENOX) injection  40 mg Subcutaneous Q24H  . lactulose  30 g Oral TID  . LORazepam  0-4 mg Oral Q12H  . magnesium oxide  400 mg Oral BID  . metoprolol tartrate  25 mg Oral BID  . multivitamin with minerals  1 tablet Oral Daily  . nicotine  21 mg Transdermal Daily  . sodium chloride flush  3 mL Intravenous Q12H  . thiamine  100 mg Oral Daily   Continuous Infusions:    LOS: 2 days    Time spent: 35 minutes    Othman Masur, MD [facility] Pager [phone]  If 7PM-7AM, please contact night-coverage [email or web address] Password [email or web address] 02/17/2016, 6:47 PM

## 2016-02-18 LAB — BASIC METABOLIC PANEL
ANION GAP: 10 (ref 5–15)
BUN: 6 mg/dL (ref 6–20)
CHLORIDE: 100 mmol/L — AB (ref 101–111)
CO2: 21 mmol/L — AB (ref 22–32)
CREATININE: 0.59 mg/dL — AB (ref 0.61–1.24)
Calcium: 9.1 mg/dL (ref 8.9–10.3)
GFR calc non Af Amer: >60 mL/min (ref >60)
Glucose, Bld: 93 mg/dL (ref 65–99)
POTASSIUM: 4.6 mmol/L (ref 3.5–5.1)
SODIUM: 131 mmol/L — AB (ref 135–145)

## 2016-02-18 LAB — VITAMIN B1: Vitamin B1 (Thiamine): 196 nmol/L (ref 66.5–200.0)

## 2016-02-18 LAB — MAGNESIUM: MAGNESIUM: 1.7 mg/dL (ref 1.7–2.4)

## 2016-02-18 MED ORDER — OXYCODONE HCL 5 MG PO TABS
5.0000 mg | ORAL_TABLET | ORAL | Status: DC | PRN
  Administered 2016-02-18 – 2016-02-19 (×2): 5 mg via ORAL
  Filled 2016-02-18 (×2): qty 1

## 2016-02-18 MED ORDER — FUROSEMIDE 10 MG/ML IJ SOLN
20.0000 mg | Freq: Once | INTRAMUSCULAR | Status: AC
  Administered 2016-02-18: 20 mg via INTRAVENOUS
  Filled 2016-02-18: qty 2

## 2016-02-18 MED ORDER — MAGNESIUM SULFATE 4 GM/100ML IV SOLN
4.0000 g | Freq: Once | INTRAVENOUS | Status: AC
  Administered 2016-02-18: 4 g via INTRAVENOUS
  Filled 2016-02-18: qty 100

## 2016-02-18 MED ORDER — TRAMADOL HCL 50 MG PO TABS
50.0000 mg | ORAL_TABLET | Freq: Four times a day (QID) | ORAL | Status: DC | PRN
  Administered 2016-02-19: 50 mg via ORAL
  Filled 2016-02-18: qty 1

## 2016-02-18 NOTE — Progress Notes (Signed)
PROGRESS NOTE    Albert PoliceChristopher Lara  EXB:[id number] DOB: [birth year] DOA: 02/15/2016 PCP: Almedia BallsKELLY,SAM, MD    Brief Narrative:  Albert PoliceChristopher Lara is a 40 y.o. male with medical history significant of alcohol abuse, tobacco abuse disorder, hypertension, GERD, peripheral neuropathy who was recently admitted and discharged from 01/26/2016 to 02/02/2016 due to multiple acute electrolyte abnormalities and alcohol detoxification who presented to [facility] due to hypertension and early DTs symptoms.  Per patient, he was trying to get into a rehabilitation facility earlier this week, but was turned down due to not having his prescription medications. He states that he subsequently got the prescriptions, but was not accepted into the rehabilitation facility due to hypertension. He denies headache, blurry vision, chest pain, palpitations, dizziness, diaphoresis, pitting edema of the lower extremities, PND or orthopnea.  ED Course: The patient was given Ativan and labetalol reporting relief. Workup showed leukocytosis of 16.2 K, macrocytic anemia with a hemoglobin level of 11.6 g/dL, hypomagnesemia with 1.3 mg/dL, mild increased ammonia level 44 mmol/L.    Assessment & Plan:   Principal Problem:   Alcohol withdrawal (HCC) Active Problems:   Hypomagnesemia   Peripheral neuropathy (HCC)   Hyperammonemia (HCC)   Essential hypertension   Leukocytosis   Macrocytic anemia   Acute confusional state  #1 alcohol withdrawal Patient with no DTs noted. Patient however noted to be somewhat confused per nursing stating that he was going outside and looking for relatives who have not been here. It was also noted that patient refused lactulose 2 days ago, however being compliant now. Continue the Ativan withdrawal protocol, thiamine, multivitamin, folic acid. Follow.  #2 hypertension Stable. Continue current regimen of Lopressor.  #3 confusion/elevated ammonia levels/?hepatic  encephalopathy Per nursing patient with noted confusion stating it was snowing outside. Patient also looking for family that have not been here per nursing. Patient was noted to have refused lactulose 2 days ago/yesterday morning. Ammonia level is elevated. Folate level 737. B-12 levels at 381. Increase lactulose to 3 times a day. Follow.   #4 hypomagnesemia Replete.  #5 leukocytosis Questionable etiology. WBC trending down. Patient may have a chronic leukocytosis. Patient with no respiratory symptoms. Urinalysis obtained on admission was unremarkable. Recent chest x-ray obtained 02/11/2016 was negative for any acute infiltrate. Patient is afebrile. Monitor for now.  #6 tobacco abuse Nicotine patch.   DVT prophylaxis: SCDs Code Status: Full Family Communication: Updated patient. No family at bedside. Disposition Plan: Home when medically stable with improvement with blood pressure and confusion.   Consultants:   None  Procedures:   None  Antimicrobials:   None   Subjective: Patient denies CP. No SOB. Patient states hallucinated yesterday when he saw a man walking a monkey.  Objective: Vitals:   02/17/16 1330 02/17/16 2030 02/18/16 0409 02/18/16 0932  BP: (!) 111/93 120/90 112/86 119/89  Pulse: 96 (!) 112 (!) 112 (!) 115  Resp: 17 18 18 18   Temp: 98.2 F (36.8 C) 99.5 F (37.5 C) 98.7 F (37.1 C) 98.6 F (37 C)  TempSrc: Oral Oral Oral Oral  SpO2: 99% 100% 100% 100%  Weight:      Height:        Intake/Output Summary (Last 24 hours) at 02/18/16 1000 Last data filed at 02/18/16 0410  Gross per 24 hour  Intake              500 ml  Output              401 ml  Net               99 ml   Filed Weights   02/15/16 0953 02/15/16 1724  Weight: 68 kg (150 lb) 67.7 kg (149 lb 4 oz)    Examination:  General exam: Appears calm and comfortable  Respiratory system: Clear to auscultation. Respiratory effort normal. Cardiovascular system: S1 & S2 heard, RRR. No JVD,  murmurs, rubs, gallops or clicks. No pedal edema. Gastrointestinal system: Abdomen is nondistended, soft and nontender. No organomegaly or masses felt. Normal bowel sounds heard. Central nervous system: Alert and oriented. No focal neurological deficits. Extremities: Symmetric 5 x 5 power. Skin: No rashes, lesions or ulcers Psychiatry: Judgement and insight appear normal. Mood & affect appropriate.     Data Reviewed: I have personally reviewed following labs and imaging studies  CBC:  Recent Labs Lab 02/11/16 1950 02/12/16 0425 02/15/16 1020 02/16/16 0253 02/16/16 1928 02/17/16 0249  WBC 15.3* 14.0* 16.2* 13.8*  --  12.7*  NEUTROABS 10.6*  --  12.1* 8.9*  --   --   HGB 12.3* 10.9* 11.6* 11.4*  --  11.0*  HCT 33.7* 32.1* 33.1* 33.9* 34.5* 33.2*  MCV 103.7* 109.6* 107.5* 109.7*  --  108.5*  PLT 468* 392 269 252  --  244   Basic Metabolic Panel:  Recent Labs Lab 02/12/16 0425 02/15/16 1020 02/15/16 1918 02/16/16 0253 02/17/16 0249 02/18/16 0411  NA 137 135 135 134* 129* 131*  K 3.9 4.1 4.1 3.9 4.2 4.6  CL 102 105 104 104 100* 100*  CO2 24 23 26 22  21* 21*  GLUCOSE 126* 101* 117* 108* 85 93  BUN <5* 9 5* <5* <5* 6  CREATININE 0.62 0.58* 0.65 0.53* 0.49* 0.59*  CALCIUM 8.4* 8.7* 8.4* 8.2* 8.6* 9.1  MG 1.7  --  1.3* 2.2 1.8 1.7   GFR: Estimated Creatinine Clearance: 118.7 mL/min (by C-G formula based on SCr of 0.8 mg/dL). Liver Function Tests:  Recent Labs Lab 02/11/16 1950 02/15/16 1918 02/16/16 0253  AST 108* 58* 58*  ALT 52 34 36  ALKPHOS 191* 143* 156*  BILITOT 0.7 0.7 0.7  PROT 7.0 6.1* 6.4*  ALBUMIN 3.3* 2.9* 3.0*   No results for input(s): LIPASE, AMYLASE in the last 168 hours.  Recent Labs Lab 02/15/16 1918 02/16/16 1928 02/17/16 0249  AMMONIA 44* 75* 71*   Coagulation Profile:  Recent Labs Lab 02/15/16 1918  INR 1.32   Cardiac Enzymes:  Recent Labs Lab 02/15/16 1020 02/15/16 1235  CKTOTAL 51  --   TROPONINI  --  <0.03   BNP  (last 3 results) No results for input(s): PROBNP in the last 8760 hours. HbA1C: No results for input(s): HGBA1C in the last 72 hours. CBG: No results for input(s): GLUCAP in the last 168 hours. Lipid Profile: No results for input(s): CHOL, HDL, LDLCALC, TRIG, CHOLHDL, LDLDIRECT in the last 72 hours. Thyroid Function Tests: No results for input(s): TSH, T4TOTAL, FREET4, T3FREE, THYROIDAB in the last 72 hours. Anemia Panel:  Recent Labs  02/16/16 1928  VITAMINB12 381   Sepsis Labs:  Recent Labs Lab 02/11/16 2211 02/13/16 0045  LATICACIDVEN 2.29* 1.5    Recent Results (from the past 240 hour(s))  Culture, blood (Routine X 2) w Reflex to ID Panel     Status: None   Collection Time: 02/11/16  9:25 PM  Result Value Ref Range Status   Specimen Description BLOOD LEFT ANTECUBITAL  Final   Special Requests   Final  BOTTLES DRAWN AEROBIC AND ANAEROBIC AER 5cc ANA 5cc   Culture   Final    NO GROWTH 5 DAYS Performed at [facility]    Report Status 02/17/2016 FINAL  Final  Culture, blood (Routine X 2) w Reflex to ID Panel     Status: None   Collection Time: 02/11/16 10:15 PM  Result Value Ref Range Status   Specimen Description BLOOD RIGHT ANTECUBITAL  Final   Special Requests   Final    BOTTLES DRAWN AEROBIC AND ANAEROBIC AER 5cc ANA 5cc   Culture   Final    NO GROWTH 5 DAYS Performed at [facility]    Report Status 02/17/2016 FINAL  Final         Radiology Studies: No results found.      Scheduled Meds: . enoxaparin (LOVENOX) injection  40 mg Subcutaneous Q24H  . lactulose  30 g Oral TID  . LORazepam  0-4 mg Oral Q12H  . magnesium oxide  400 mg Oral BID  . magnesium sulfate 1 - 4 g bolus IVPB  4 g Intravenous Once  . metoprolol tartrate  25 mg Oral BID  . multivitamin with minerals  1 tablet Oral Daily  . nicotine  21 mg Transdermal Daily  . sodium chloride flush  3 mL Intravenous Q12H  . thiamine  100 mg Oral Daily   Continuous  Infusions:    LOS: 3 days    Time spent: 35 minutes    Laquasha Groome, MD [facility] Pager [phone]  If 7PM-7AM, please contact night-coverage [email or web address] Password [email or web address] 02/18/2016, 10:00 AM

## 2016-02-19 DIAGNOSIS — G588 Other specified mononeuropathies: Secondary | ICD-10-CM

## 2016-02-19 LAB — BASIC METABOLIC PANEL
Anion gap: 9 (ref 5–15)
BUN: 7 mg/dL (ref 6–20)
CHLORIDE: 103 mmol/L (ref 101–111)
CO2: 22 mmol/L (ref 22–32)
CREATININE: 0.58 mg/dL — AB (ref 0.61–1.24)
Calcium: 9.2 mg/dL (ref 8.9–10.3)
GFR calc Af Amer: >60 mL/min (ref >60)
Glucose, Bld: 112 mg/dL — ABNORMAL HIGH (ref 65–99)
Potassium: 4.1 mmol/L (ref 3.5–5.1)
SODIUM: 134 mmol/L — AB (ref 135–145)

## 2016-02-19 LAB — MAGNESIUM: Magnesium: 2 mg/dL (ref 1.7–2.4)

## 2016-02-19 LAB — AMMONIA: Ammonia: 51 umol/L — ABNORMAL HIGH (ref 9–35)

## 2016-02-19 MED ORDER — LACTULOSE 10 GM/15ML PO SOLN: 30.0000 g | Freq: Three times a day (TID) | ORAL | 3 refills | Status: AC

## 2016-02-19 MED ORDER — NICOTINE 21 MG/24HR TD PT24: 21.0000 mg | MEDICATED_PATCH | Freq: Every day | TRANSDERMAL | 0 refills | Status: AC

## 2016-02-19 NOTE — Discharge Summary (Signed)
Physician Discharge Summary  Keane PoliceChristopher Furber AVW:[id number] DOB: Jul 21, 1975 DOA: 02/15/2016  PCP: Almedia BallsKELLY,SAM, MD  Admit date: 02/15/2016 Discharge date: 02/19/2016  Time spent: 65 minutes  Recommendations for Outpatient Follow-up:  1. Patient will be discharged home. Patient is to follow-up with PCP as previously scheduled. On follow-up patient blood pressure may be reassessed. 2. Patient is also to follow-up at the outpatient alcohol rehabilitation center.   Discharge Diagnoses:  Principal Problem:   Alcohol withdrawal (HCC) Active Problems:   Hypomagnesemia   Peripheral neuropathy (HCC)   Hyperammonemia (HCC)   Essential hypertension   Leukocytosis   Macrocytic anemia   Acute confusional state   Discharge Condition: Stable and improved  Diet recommendation: Regular  Filed Weights   02/15/16 0953 02/15/16 1724  Weight: 68 kg (150 lb) 67.7 kg (149 lb 4 oz)    History of present illness:  Per Dr Celine Ahrrtiz Lawyer Mayford KnifeWilliams is a 40 y.o. male with medical history significant of alcohol abuse, tobacco abuse disorder, hypertension, GERD, peripheral neuropathy who was recently admitted and discharged from 01/26/2016 to 02/02/2016 due to multiple acute electrolyte abnormalities and alcohol detoxification who presented to [facility] due to hypertension and early DTs symptoms.  Per patient, he was trying to get into a rehabilitation facility earlier this week, but was turned down due to not having his prescription medications. He states that he subsequently got the prescriptions, but was not accepted into the rehabilitation facility due to hypertension. He denies headache, blurry vision, chest pain, palpitations, dizziness, diaphoresis, pitting edema of the lower extremities, PND or orthopnea.  ED Course: The patient was given Ativan and labetalol reporting relief. Workup showed leukocytosis of 16.2 K, macrocytic anemia with a hemoglobin level of 11.6 g/dL,  hypomagnesemia with 1.3 mg/dL, mild increased ammonia level 44 mmol/L.   Hospital Course:  #1 alcohol withdrawal Patient with no DTs noted. Patient was placed on Ativan withdrawal protocol and monitored. Patient had a few bouts of cold fusion which was felt to be secondary to probable elevated ammonia levels/hepatic encephalopathy. Patient remained stable throughout the hospitalization maintained on thiamine and multivitamin and folic acid. Patient did not have any further signs of withdrawal during the hospitalization and will be discharged home in stable and improved condition.  #2 hypertension Stable. Continued on home regimen of Lopressor.  #3 confusion/elevated ammonia levels/?hepatic encephalopathy Per nursing patient with noted confusion stating it was snowing outside. Patient also looking for family that have not been here per nursing. Patient was noted to have refused lactulose early on in the hospitalization. Ammonia levels were checked and were noted to be elevated as high as 75.  Folate level 737. B-12 levels at 381. Patient was placed back on home regimen of lactulose and importance of compliance with lactulose was stressed to patient. Patient improved clinically such that by day of discharge patient was back to baseline and confusion had resolved.   #4 hypomagnesemia Repleted.  #5 leukocytosis Questionable etiology. WBC trending down. Patient may have a chronic leukocytosis. Patient with no respiratory symptoms. Urinalysis obtained on admission was unremarkable. Recent chest x-ray obtained 02/11/2016 was negative for any acute infiltrate. Patient remained afebrile. Monitor for now.  #6 tobacco abuse Patient was placed on Nicotine patch.  Procedures:  None  Consultations:  None  Discharge Exam: Vitals:   02/19/16 0548 02/19/16 0900  BP: (!) 109/91 116/83  Pulse: (!) 109 (!) 113  Resp: 17   Temp: 98.1 F (36.7 C)     General: NAD Cardiovascular:  RRR Respiratory: CTAB  Discharge Instructions   Discharge Instructions    Diet general    Complete by:  As directed   Increase activity slowly    Complete by:  As directed     Discharge Medication List as of 02/19/2016 11:53 AM    START taking these medications   Details  nicotine (NICODERM CQ - DOSED IN MG/24 HOURS) 21 mg/24hr patch Place 1 patch (21 mg total) onto the skin daily., Starting Sun 02/19/2016, Print      CONTINUE these medications which have NOT CHANGED   Details  amitriptyline (ELAVIL) 25 MG tablet Take 1 tablet (25 mg total) by mouth at bedtime., Starting Tue 01/31/2016, Print    calcium carbonate (OS-CAL - DOSED IN MG OF ELEMENTAL CALCIUM) 1250 (500 Ca) MG tablet Take 1 tablet (500 mg of elemental calcium total) by mouth 2 (two) times daily with a meal., Starting Tue 01/31/2016, Print    feeding supplement (BOOST / RESOURCE BREEZE) LIQD Take 1 Container by mouth 3 (three) times daily between meals., Starting Tue 01/31/2016, Print    folic acid (FOLVITE) 1 MG tablet Take 1 tablet (1 mg total) by mouth daily., Starting Tue 01/31/2016, Print    gabapentin (NEURONTIN) 400 MG capsule Take 1 capsule (400 mg total) by mouth 3 (three) times daily., Starting Tue 01/31/2016, Print    magnesium oxide (MAG-OX) 400 (241.3 Mg) MG tablet Take 1 tablet (400 mg total) by mouth 2 (two) times daily., Starting Tue 01/31/2016, Print    metoprolol tartrate (LOPRESSOR) 25 MG tablet Take 1 tablet (25 mg total) by mouth 2 (two) times daily., Starting Tue 01/31/2016, Print    Multiple Vitamin (MULTIVITAMIN WITH MINERALS) TABS tablet Take 1 tablet by mouth daily., Starting Tue 01/31/2016, Print    thiamine 100 MG tablet Take 1 tablet (100 mg total) by mouth daily., Starting Tue 01/31/2016, Print    traMADol (ULTRAM) 50 MG tablet Take 1 tablet (50 mg total) by mouth every 6 (six) hours as needed for moderate pain., Starting Tue 01/31/2016, Print    vitamin B-12 1000 MCG tablet Take 1 tablet (1,000 mcg total)  by mouth daily., Starting Tue 01/31/2016, Print    Vitamin D, Ergocalciferol, (DRISDOL) 50000 units CAPS capsule Take 1 capsule (50,000 Units total) by mouth every 7 (seven) days., Starting Tue 01/31/2016, Print    lactulose (CHRONULAC) 10 GM/15ML solution Take 45 mLs (30 g total) by mouth 3 (three) times daily., Starting Thu 02/02/2016, Print       No Known Allergies Follow-up Information    Almedia Balls, MD .   Specialty:  Family Medicine Why:  f/u as previously scheduled. Contact information: [address] [facility] [place] [address] [phone]            The results of significant diagnostics from this hospitalization (including imaging, microbiology, ancillary and laboratory) are listed below for reference.    Significant Diagnostic Studies: Mr Brain 60 Contrast  Result Date: 02/01/2016 CLINICAL DATA:  Initial evaluation for possible ward with the encephalopathy. Heavy history of dog off all abuse. EXAM: MRI HEAD WITHOUT CONTRAST TECHNIQUE: Multiplanar, multiecho pulse sequences of the brain and surrounding structures were obtained without intravenous contrast. COMPARISON:  None. FINDINGS: Study mildly degraded by motion artifact. Advanced cerebral atrophy for patient age. No significant cerebral white matter disease present. No abnormal foci of restricted diffusion to suggest acute ischemia. Gray-white matter differentiation maintained. Major intracranial vascular flow voids are preserved. No areas of chronic infarction.  No acute or chronic intracranial hemorrhage. No mass lesion, midline shift, or mass effect. No hydrocephalus. No extra-axial fluid collection. Major dural sinuses are grossly patent. No signal changes to suggest acute Wernicke's encephalopathy identified. Periaqueductal gray matter relatively normal. Mammillary bodies symmetric and within normal limits for size without significant swelling. Craniocervical junction within normal limits.  Visualized upper cervical spine unremarkable. Pituitary gland within normal limits. No acute abnormality about the globes and orbits. Mild scattered mucosal thickening within the ethmoidal air cells and maxillary sinuses. Paranasal sinuses are otherwise clear. No significant mastoid effusion. Inner ear structures grossly normal. Bone marrow signal intensity grossly normal. No scalp soft tissue abnormality. IMPRESSION: 1. No acute intracranial process identified. No signal changes to suggest acute Wernicke's encephalopathy identified. 2. Advanced cerebral atrophy for patient age. Electronically Signed   By: Rise Mu M.D.   On: 02/01/2016 06:48   Dg Chest Portable 1 View  Result Date: 02/11/2016 CLINICAL DATA:  Fever EXAM: PORTABLE CHEST 1 VIEW COMPARISON:  None. FINDINGS: Normal heart size and mediastinal contours. No acute infiltrate or edema. No effusion or pneumothorax. No acute osseous findings. IMPRESSION: No active disease. Electronically Signed   By: Marnee Spring M.D.   On: 02/11/2016 22:46   US Abdomen Limited Ruq  Result Date: 01/28/2016 CLINICAL DATA:  40 year old male EXAM: US ABDOMEN LIMITED - RIGHT UPPER QUADRANT COMPARISON:  None. FINDINGS: Gallbladder: Lumen of the gallbladder is approximately 50% occupied with reflective microlithiasis/ sludge at the dependent aspects. Mild wall thickening with trace pericholecystic fluid. Negative sonographic Murphy's sign. Common bile duct: Diameter: 3 mm-4 mm Liver: Enlarged cranial caudal span of the liver with relatively heterogeneous echotexture. IMPRESSION: Cholelithiasis with approximately 50% of the lumen of the gallbladder replaced with microlithiasis/sludge. No sonographic evidence of acute cholecystitis. If there is ongoing concern for acute cholecystitis, correlation with lab values, presentation, and HIDA study is recommended. Hepatomegaly Signed, Yvone Neu. Loreta Ave, DO [facility]  Radiology Electronically Signed   By: Gilmer Mor D.O.   On: 01/28/2016 10:22    Microbiology: Recent Results (from the past 240 hour(s))  Culture, blood (Routine X 2) w Reflex to ID Panel     Status: None   Collection Time: 02/11/16  9:25 PM  Result Value Ref Range Status   Specimen Description BLOOD LEFT ANTECUBITAL  Final   Special Requests   Final    BOTTLES DRAWN AEROBIC AND ANAEROBIC AER 5cc ANA 5cc   Culture   Final    NO GROWTH 5 DAYS Performed at [facility]    Report Status 02/17/2016 FINAL  Final  Culture, blood (Routine X 2) w Reflex to ID Panel     Status: None   Collection Time: 02/11/16 10:15 PM  Result Value Ref Range Status   Specimen Description BLOOD RIGHT ANTECUBITAL  Final   Special Requests   Final    BOTTLES DRAWN AEROBIC AND ANAEROBIC AER 5cc ANA 5cc   Culture   Final    NO GROWTH 5 DAYS Performed at [facility]    Report Status 02/17/2016 FINAL  Final     Labs: Basic Metabolic Panel:  Recent Labs Lab 02/15/16 1918 02/16/16 0253 02/17/16 0249 02/18/16 0411 02/19/16 0328  NA 135 134* 129* 131* 134*  K 4.1 3.9 4.2 4.6 4.1  CL 104 104 100* 100* 103  CO2 26 22 21* 21* 22  GLUCOSE 117* 108* 85 93 112*  BUN 5* <5* <5* 6 7  CREATININE 0.65  0.53* 0.49* 0.59* 0.58*  CALCIUM 8.4* 8.2* 8.6* 9.1 9.2  MG 1.3* 2.2 1.8 1.7 2.0   Liver Function Tests:  Recent Labs Lab 02/15/16 1918 02/16/16 0253  AST 58* 58*  ALT 34 36  ALKPHOS 143* 156*  BILITOT 0.7 0.7  PROT 6.1* 6.4*  ALBUMIN 2.9* 3.0*   No results for input(s): LIPASE, AMYLASE in the last 168 hours.  Recent Labs Lab 02/15/16 1918 02/16/16 1928 02/17/16 0249 02/19/16 0328  AMMONIA 44* 75* 71* 51*   CBC:  Recent Labs Lab 02/15/16 1020 02/16/16 0253 02/16/16 1928 02/17/16 0249  WBC 16.2* 13.8*  --  12.7*  NEUTROABS 12.1* 8.9*  --   --   HGB 11.6* 11.4*  --  11.0*  HCT 33.1* 33.9* 34.5* 33.2*  MCV 107.5* 109.7*  --  108.5*  PLT 269 252  --  244   Cardiac  Enzymes:  Recent Labs Lab 02/15/16 1020 02/15/16 1235  CKTOTAL 51  --   TROPONINI  --  <0.03   BNP: BNP (last 3 results)  Recent Labs  01/26/16 2055  BNP 51.3    ProBNP (last 3 results) No results for input(s): PROBNP in the last 8760 hours.  CBG: No results for input(s): GLUCAP in the last 168 hours.     SignedRamiro Harvest MD.  [facility] 02/19/2016, 1:16 PM

## 2016-02-19 NOTE — Progress Notes (Addendum)
Patient in stable condition, this RN went over discharge instructions with patient, he verbalised understanding, home medications returned to patient ,paper prescriptions given to patient patient belongings at bedside, iv removed, tele dc ccmd notified, patient offered a wheelchair off the unit but he refused and chose to ambulate by self off the unit

## 2017-01-15 IMAGING — DX DG CHEST 1V PORT
1 series · 1 of 1 positions shown · non-contrast
Comparison: None.

CLINICAL DATA: Fever

EXAM:
PORTABLE CHEST 1 VIEW

[chest ap]
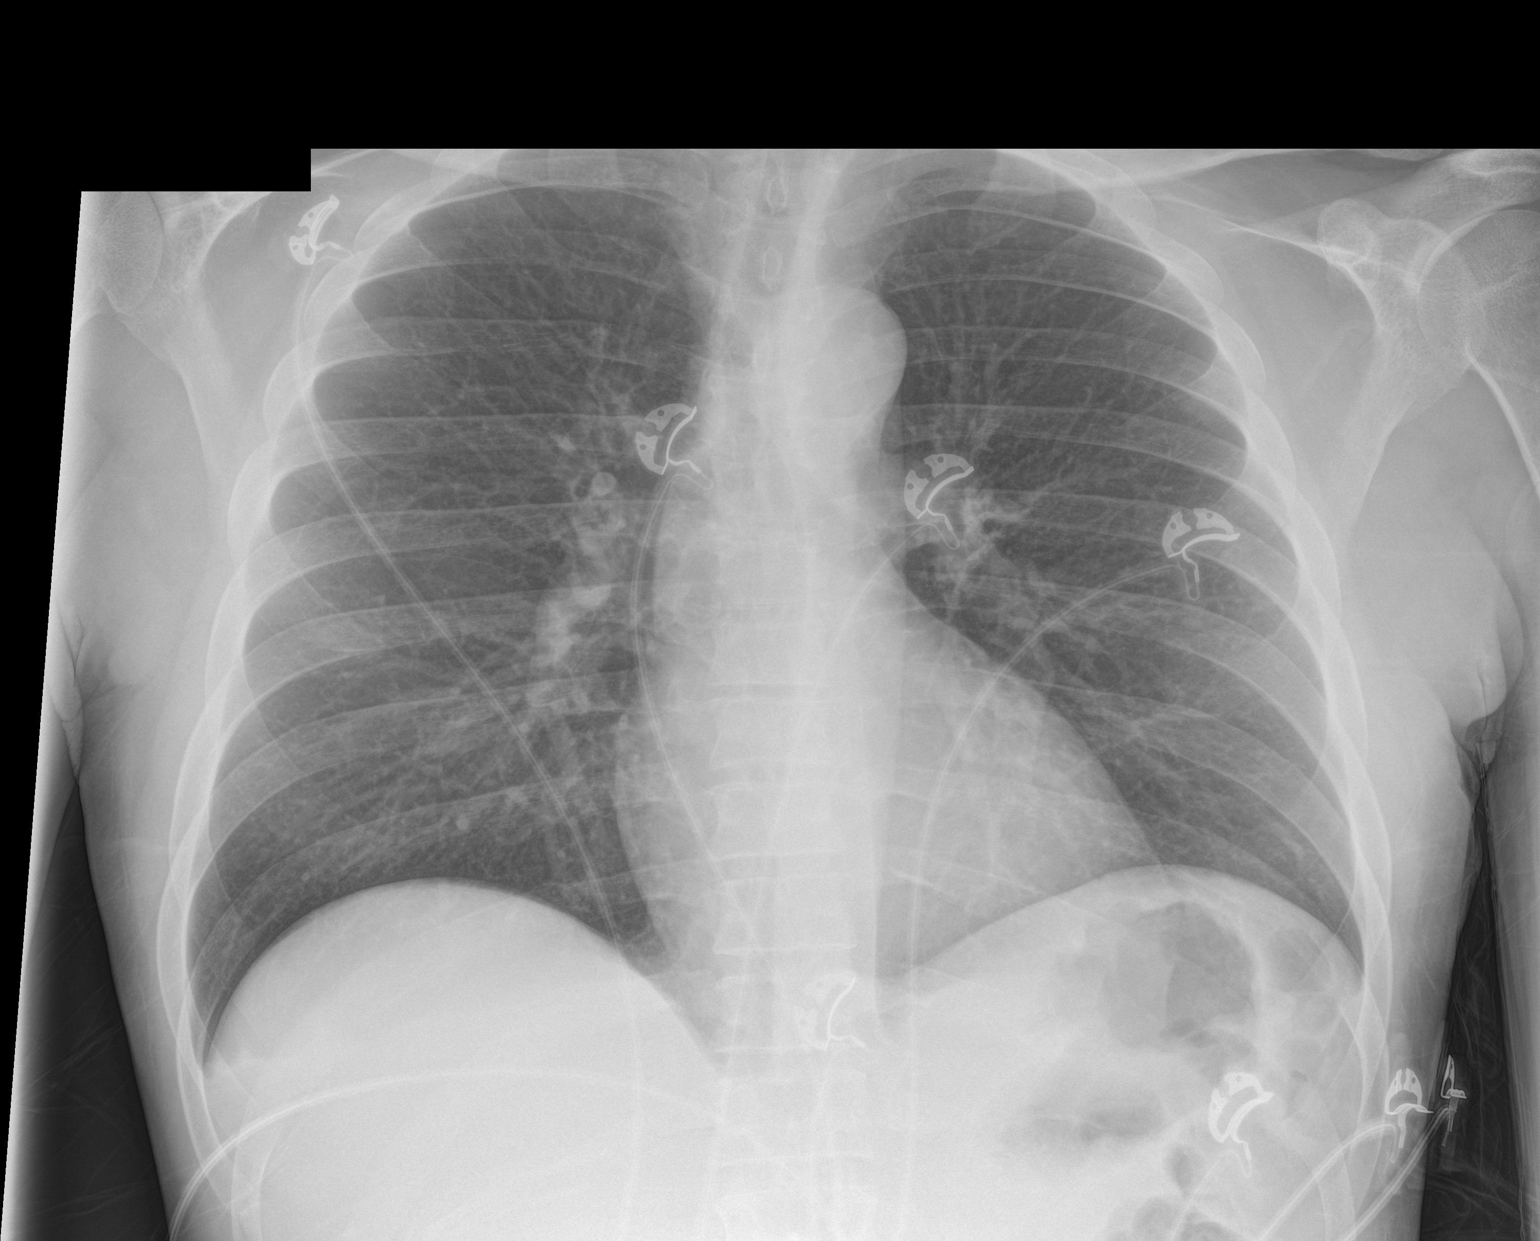

[1 of 1 positions shown; findings below may reference images not displayed]

FINDINGS: Normal heart size and mediastinal contours. No acute infiltrate or
edema. No effusion or pneumothorax. No acute osseous findings.
IMPRESSION: No active disease.
